# Patient Record
Sex: Female | Born: 1980
Health system: Southern US, Community
[De-identification: ages and names within clinical notes are randomized; demographics above are authoritative.]

## PROBLEM LIST (undated history)

## (undated) ENCOUNTER — Inpatient Hospital Stay (HOSPITAL_COMMUNITY): Payer: Self-pay

## (undated) DIAGNOSIS — Z789 Other specified health status: Secondary | ICD-10-CM

## (undated) DIAGNOSIS — B379 Candidiasis, unspecified: Secondary | ICD-10-CM

## (undated) HISTORY — PX: NO PAST SURGERIES: SHX2092

## (undated) HISTORY — DX: Other specified health status: Z78.9

## (undated) HISTORY — DX: Candidiasis, unspecified: B37.9

---
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
## 2001-01-27 ENCOUNTER — Ambulatory Visit (HOSPITAL_COMMUNITY): Admission: RE | Admit: 2001-01-27 | Discharge: 2001-01-27 | Payer: Self-pay | Admitting: *Deleted

## 2001-05-20 ENCOUNTER — Inpatient Hospital Stay (HOSPITAL_COMMUNITY): Admission: AD | Admit: 2001-05-20 | Discharge: 2001-05-22 | Payer: Self-pay | Admitting: *Deleted

## 2001-05-20 ENCOUNTER — Encounter (INDEPENDENT_AMBULATORY_CARE_PROVIDER_SITE_OTHER): Payer: Self-pay | Admitting: Specialist

## 2001-06-19 ENCOUNTER — Emergency Department (HOSPITAL_COMMUNITY): Admission: EM | Admit: 2001-06-19 | Discharge: 2001-06-19 | Payer: Self-pay

## 2001-06-19 ENCOUNTER — Encounter: Payer: Self-pay | Admitting: Emergency Medicine

## 2002-02-23 ENCOUNTER — Emergency Department (HOSPITAL_COMMUNITY): Admission: EM | Admit: 2002-02-23 | Discharge: 2002-02-23 | Payer: Self-pay | Admitting: Emergency Medicine

## 2002-12-21 ENCOUNTER — Encounter: Admission: RE | Admit: 2002-12-21 | Discharge: 2002-12-21 | Payer: Self-pay | Admitting: Sports Medicine

## 2006-03-24 ENCOUNTER — Ambulatory Visit: Payer: Self-pay | Admitting: Sports Medicine

## 2006-03-30 ENCOUNTER — Ambulatory Visit (HOSPITAL_COMMUNITY): Admission: RE | Admit: 2006-03-30 | Discharge: 2006-03-30 | Payer: Self-pay | Admitting: Sports Medicine

## 2006-04-29 ENCOUNTER — Ambulatory Visit: Payer: Self-pay | Admitting: Obstetrics & Gynecology

## 2006-05-14 ENCOUNTER — Ambulatory Visit (HOSPITAL_COMMUNITY): Admission: RE | Admit: 2006-05-14 | Discharge: 2006-05-14 | Payer: Self-pay | Admitting: Obstetrics and Gynecology

## 2006-05-14 ENCOUNTER — Ambulatory Visit: Payer: Self-pay | Admitting: Family Medicine

## 2006-06-04 ENCOUNTER — Ambulatory Visit: Payer: Self-pay | Admitting: Gynecology

## 2006-06-08 ENCOUNTER — Ambulatory Visit (HOSPITAL_COMMUNITY): Admission: RE | Admit: 2006-06-08 | Discharge: 2006-06-08 | Payer: Self-pay | Admitting: Obstetrics and Gynecology

## 2006-06-25 ENCOUNTER — Ambulatory Visit: Payer: Self-pay | Admitting: Gynecology

## 2006-07-16 ENCOUNTER — Ambulatory Visit: Payer: Self-pay | Admitting: Family Medicine

## 2006-07-30 ENCOUNTER — Ambulatory Visit (HOSPITAL_COMMUNITY): Admission: RE | Admit: 2006-07-30 | Discharge: 2006-07-30 | Payer: Self-pay | Admitting: Obstetrics and Gynecology

## 2006-07-30 ENCOUNTER — Ambulatory Visit: Payer: Self-pay | Admitting: Family Medicine

## 2006-08-06 ENCOUNTER — Ambulatory Visit: Payer: Self-pay | Admitting: Family Medicine

## 2006-08-20 ENCOUNTER — Ambulatory Visit: Payer: Self-pay | Admitting: *Deleted

## 2006-09-03 ENCOUNTER — Ambulatory Visit: Payer: Self-pay | Admitting: Family Medicine

## 2006-09-07 ENCOUNTER — Ambulatory Visit (HOSPITAL_COMMUNITY): Admission: RE | Admit: 2006-09-07 | Discharge: 2006-09-07 | Payer: Self-pay | Admitting: Obstetrics and Gynecology

## 2006-09-07 ENCOUNTER — Ambulatory Visit: Payer: Self-pay | Admitting: Obstetrics & Gynecology

## 2006-09-09 ENCOUNTER — Inpatient Hospital Stay (HOSPITAL_COMMUNITY): Admission: AD | Admit: 2006-09-09 | Discharge: 2006-09-09 | Payer: Self-pay | Admitting: Gynecology

## 2006-09-09 ENCOUNTER — Ambulatory Visit: Payer: Self-pay | Admitting: *Deleted

## 2006-09-10 ENCOUNTER — Ambulatory Visit: Payer: Self-pay | Admitting: Family Medicine

## 2006-09-14 ENCOUNTER — Ambulatory Visit (HOSPITAL_COMMUNITY): Admission: RE | Admit: 2006-09-14 | Discharge: 2006-09-14 | Payer: Self-pay | Admitting: Gynecology

## 2006-09-14 ENCOUNTER — Ambulatory Visit: Payer: Self-pay | Admitting: Obstetrics & Gynecology

## 2006-09-17 ENCOUNTER — Ambulatory Visit: Payer: Self-pay | Admitting: Family Medicine

## 2006-09-21 ENCOUNTER — Ambulatory Visit: Payer: Self-pay | Admitting: Obstetrics & Gynecology

## 2006-09-21 ENCOUNTER — Ambulatory Visit (HOSPITAL_COMMUNITY): Admission: RE | Admit: 2006-09-21 | Discharge: 2006-09-21 | Payer: Self-pay | Admitting: Gynecology

## 2006-09-24 ENCOUNTER — Ambulatory Visit: Payer: Self-pay | Admitting: Family Medicine

## 2006-09-28 ENCOUNTER — Ambulatory Visit (HOSPITAL_COMMUNITY): Admission: RE | Admit: 2006-09-28 | Discharge: 2006-09-28 | Payer: Self-pay | Admitting: Gynecology

## 2006-09-28 ENCOUNTER — Ambulatory Visit: Payer: Self-pay | Admitting: Obstetrics & Gynecology

## 2006-10-01 ENCOUNTER — Ambulatory Visit: Payer: Self-pay | Admitting: Family Medicine

## 2006-10-05 ENCOUNTER — Ambulatory Visit (HOSPITAL_COMMUNITY): Admission: RE | Admit: 2006-10-05 | Discharge: 2006-10-05 | Payer: Self-pay | Admitting: Gynecology

## 2006-10-05 ENCOUNTER — Ambulatory Visit: Payer: Self-pay | Admitting: Obstetrics & Gynecology

## 2006-10-07 ENCOUNTER — Ambulatory Visit: Payer: Self-pay | Admitting: Obstetrics and Gynecology

## 2006-10-07 ENCOUNTER — Inpatient Hospital Stay (HOSPITAL_COMMUNITY): Admission: AD | Admit: 2006-10-07 | Discharge: 2006-10-11 | Payer: Self-pay | Admitting: Obstetrics and Gynecology

## 2006-10-09 ENCOUNTER — Encounter (INDEPENDENT_AMBULATORY_CARE_PROVIDER_SITE_OTHER): Payer: Self-pay | Admitting: Specialist

## 2007-02-23 IMAGING — US US FETAL BPP W/O NONSTRESS
1 series · 18 of 23 positions shown · non-contrast
Comparison: none

CLINICAL DATA: 25-year-old female with 35 week gestation.  Decreased fetal movement.

[Series 1: us fetal bpp w/o nonstress · non-contrast · 18 of 23 slices shown]
[im 1/23]
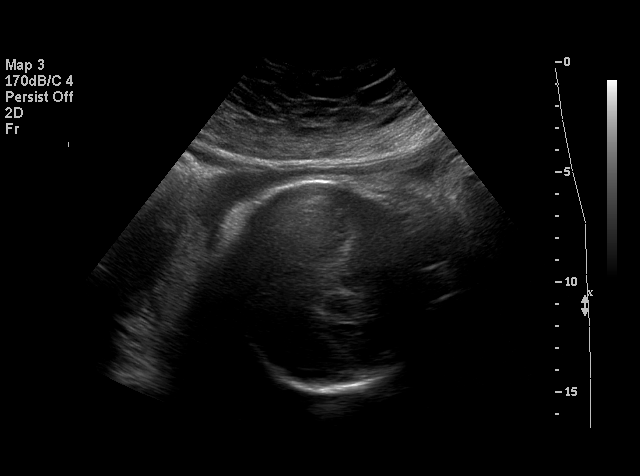
[im 2/23]
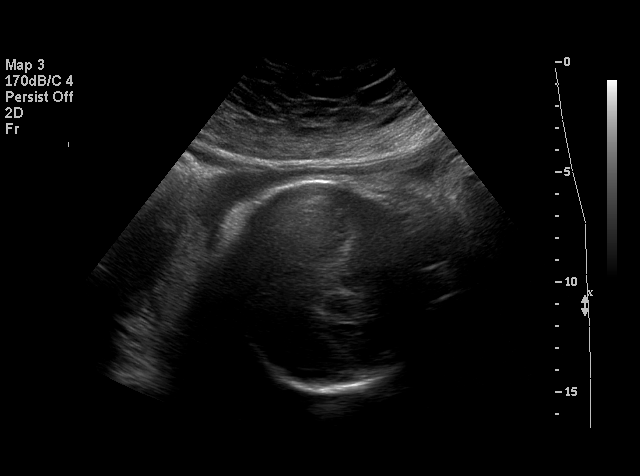
[im 4/23]
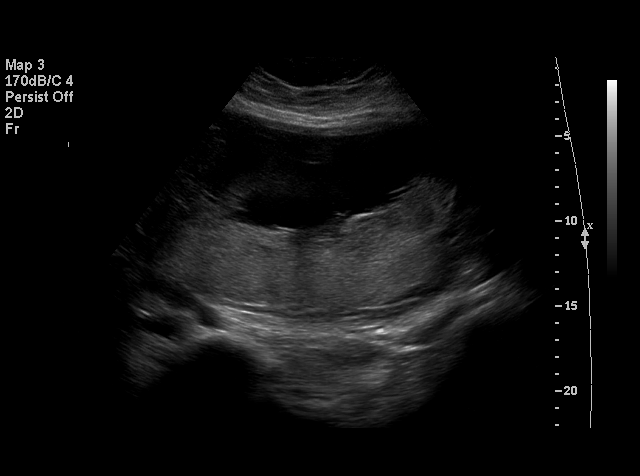
[im 5/23]
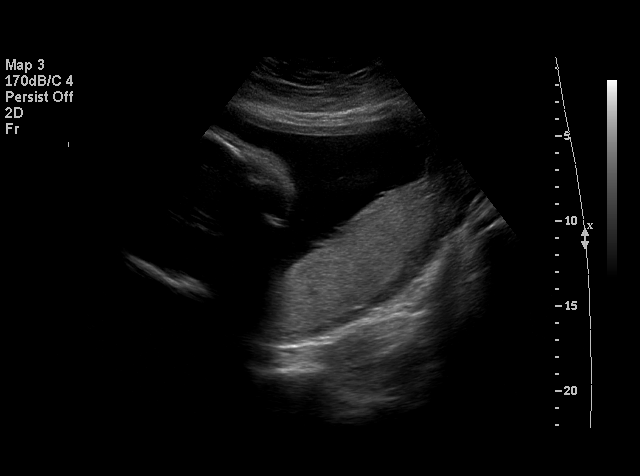
[im 6/23]
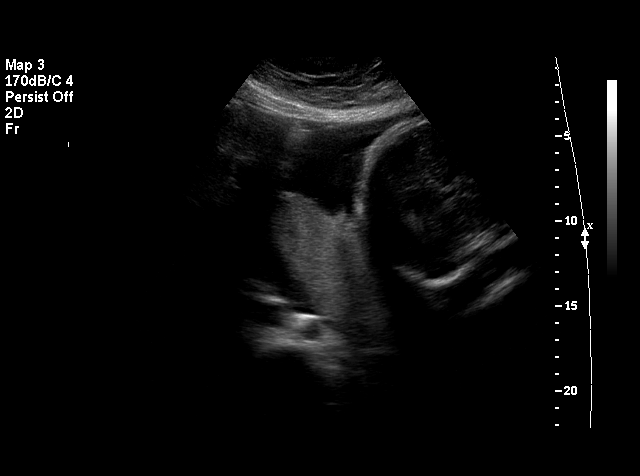
[im 8/23]
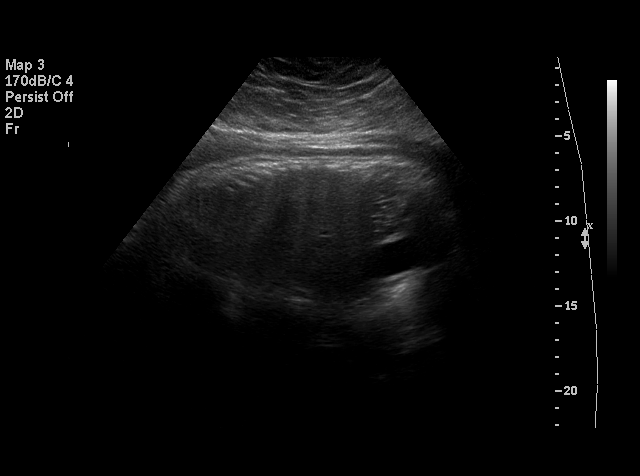
[im 9/23]
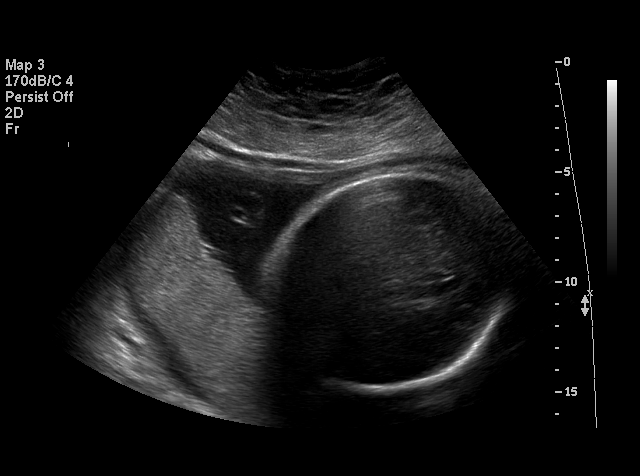
[im 10/23]
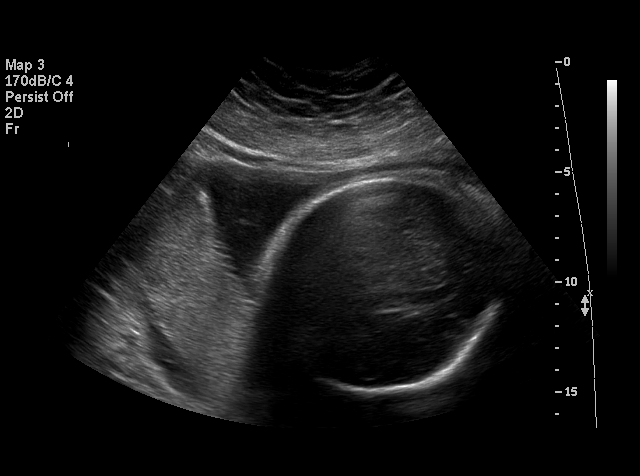
[im 11/23]
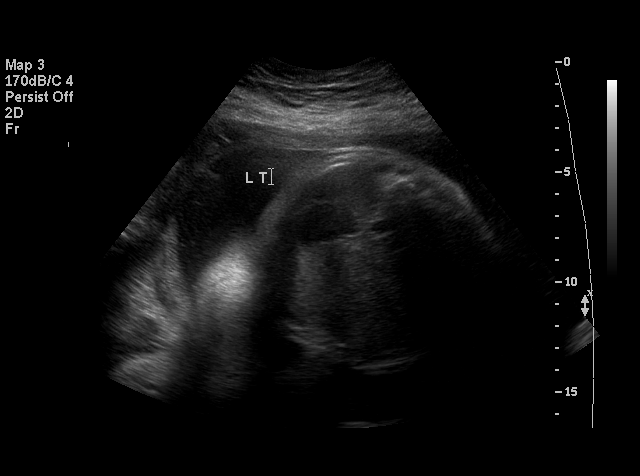
[im 13/23]
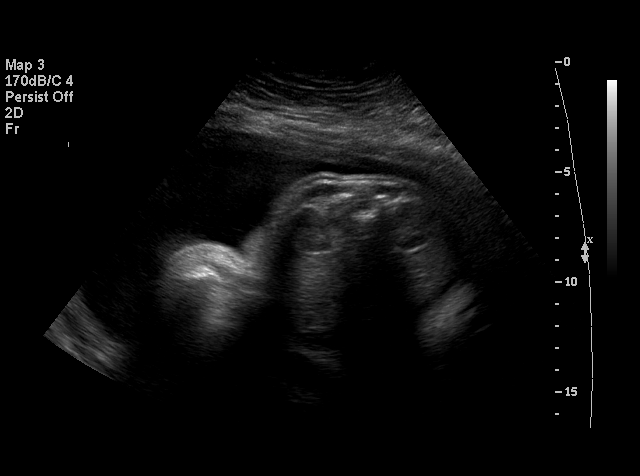
[im 14/23]
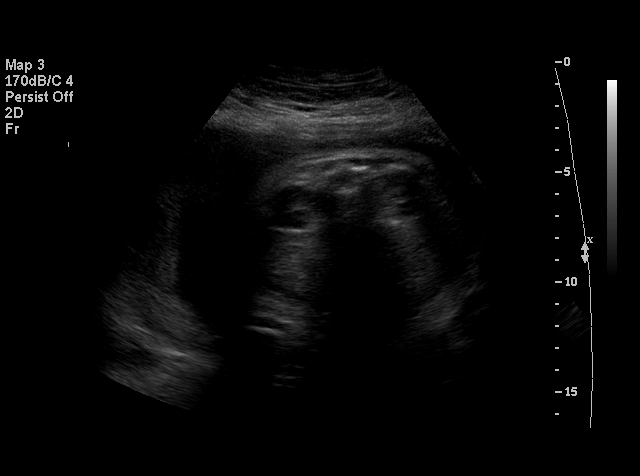
[im 15/23]
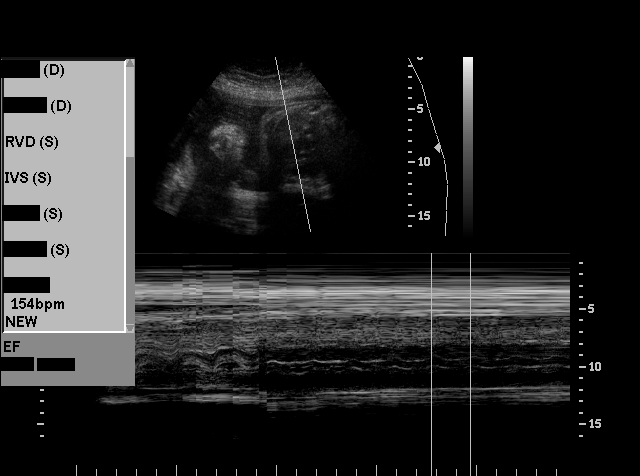
[im 16/23]
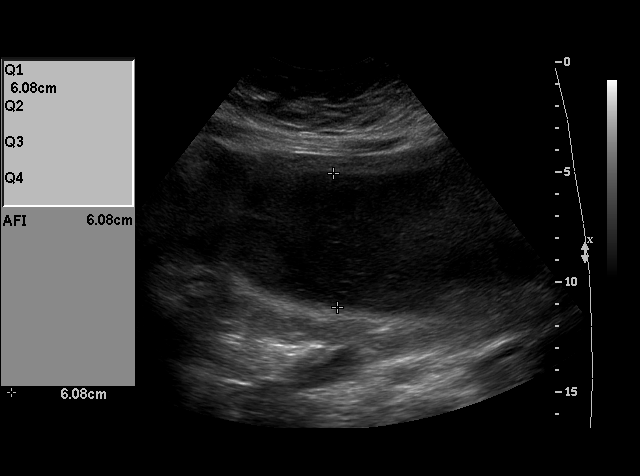
[im 18/23]
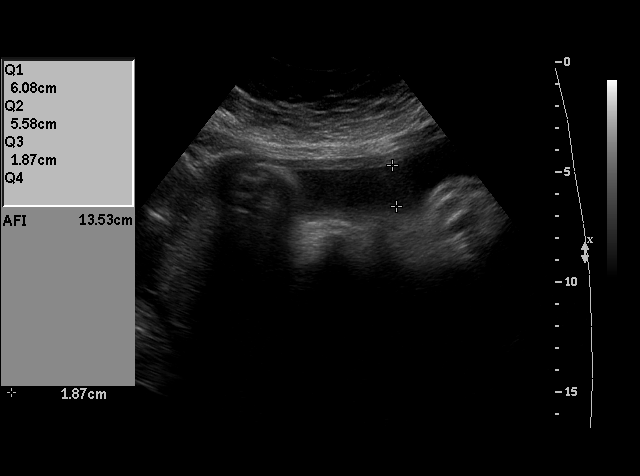
[im 19/23]
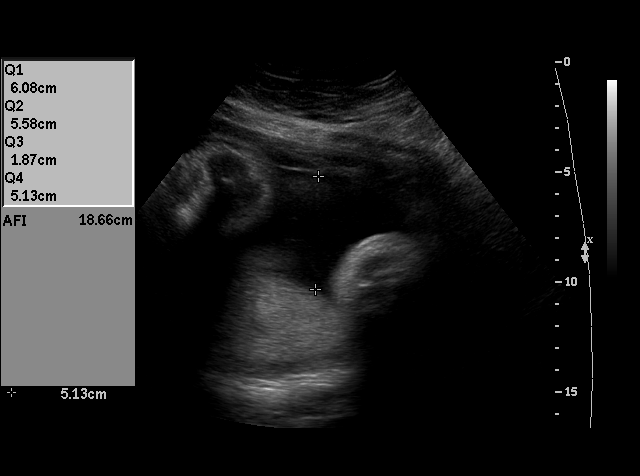
[im 20/23]
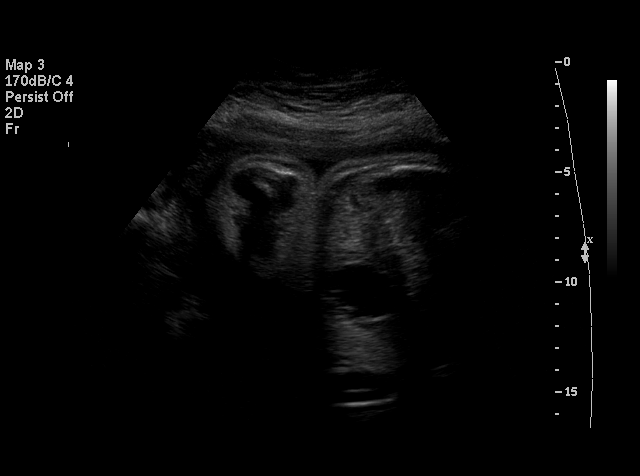
[im 22/23]
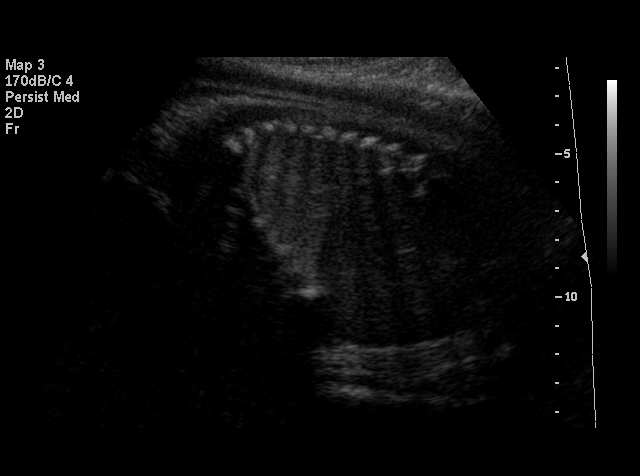
[im 23/23]
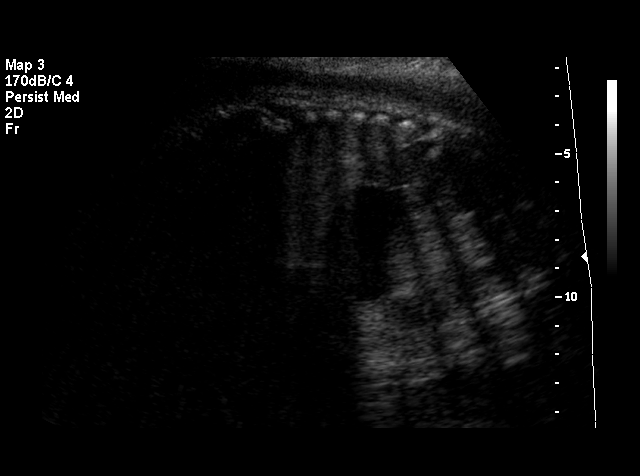

[18 of 23 positions shown; findings below may reference images not displayed]

BIOPHYSICAL PROFILE

 Number of Fetuses:  1
 Heart rate:  154 bpm
 Movement:  Yes
 Breathing:  Yes
 Presentation:  Breech
 Placental Location:  Fundal, posterior, left lateral
 Grade:  II
 Previa:  No
 Amniotic Fluid (Subjective):  Normal
 Amniotic Fluid (Objective):  AFI 18.66 cm (2th-02th %ile = 7.9 to 24.9 cm for 35 weeks) 

 Fetal measurements and complete anatomic evaluation were not requested.  The following fetal anatomy was visualized on this exam:  Lateral ventricles, stomach, kidneys, and bladder.  

 BPP SCORING
 Movements:  2  Time:  30 minutes
 Breathing:  0
 Tone:  2
 Amniotic Fluid:  2
 Total Score:  6

 MATERNAL UTERINE AND ADNEXAL FINDINGS
 Cervix:  Not evaluated; >34 weeks.
IMPRESSION: 1.  Single living intrauterine gestation in breech presentation with assigned gestational age of 35 weeks 3 days.  
 2.  Biophysical profile score of [DATE] at 30 minutes.

## 2007-08-26 ENCOUNTER — Ambulatory Visit: Payer: Self-pay | Admitting: Family Medicine

## 2007-08-26 ENCOUNTER — Encounter (INDEPENDENT_AMBULATORY_CARE_PROVIDER_SITE_OTHER): Payer: Self-pay | Admitting: Family Medicine

## 2007-08-26 DIAGNOSIS — K0510 Chronic gingivitis, plaque induced: Secondary | ICD-10-CM | POA: Insufficient documentation

## 2007-08-26 DIAGNOSIS — K047 Periapical abscess without sinus: Secondary | ICD-10-CM | POA: Insufficient documentation

## 2007-08-26 LAB — CONVERTED CEMR LAB
CO2: 23 meq/L (ref 19–32)
Calcium: 9 mg/dL (ref 8.4–10.5)
MCHC: 31.5 g/dL (ref 30.0–36.0)
MCV: 83.6 fL (ref 78.0–100.0)
Platelets: 282 10*3/uL (ref 150–400)
Potassium: 3.9 meq/L (ref 3.5–5.3)
Sodium: 138 meq/L (ref 135–145)
WBC: 5 10*3/uL (ref 4.0–10.5)

## 2007-12-13 ENCOUNTER — Ambulatory Visit: Payer: Self-pay | Admitting: Family Medicine

## 2007-12-13 ENCOUNTER — Telehealth: Payer: Self-pay | Admitting: *Deleted

## 2007-12-13 ENCOUNTER — Encounter (INDEPENDENT_AMBULATORY_CARE_PROVIDER_SITE_OTHER): Payer: Self-pay | Admitting: Family Medicine

## 2010-08-19 ENCOUNTER — Ambulatory Visit: Payer: Self-pay | Admitting: Family Medicine

## 2010-08-19 ENCOUNTER — Encounter: Payer: Self-pay | Admitting: Family Medicine

## 2010-08-19 DIAGNOSIS — E669 Obesity, unspecified: Secondary | ICD-10-CM | POA: Insufficient documentation

## 2010-08-19 DIAGNOSIS — N912 Amenorrhea, unspecified: Secondary | ICD-10-CM | POA: Insufficient documentation

## 2010-08-19 LAB — CONVERTED CEMR LAB
ALT: 13 units/L (ref 0–35)
AST: 12 units/L (ref 0–37)
Alkaline Phosphatase: 112 units/L (ref 39–117)
Antibody Screen: NEGATIVE
CO2: 21 meq/L (ref 19–32)
Creatinine, Ser: 0.56 mg/dL (ref 0.40–1.20)
Hemoglobin: 11.7 g/dL — ABNORMAL LOW (ref 12.0–15.0)
Lymphs Abs: 1.4 10*3/uL (ref 0.7–4.0)
Monocytes Relative: 5 % (ref 3–12)
Neutro Abs: 3.9 10*3/uL (ref 1.7–7.7)
Neutrophils Relative %: 69 % (ref 43–77)
RBC: 4.67 M/uL (ref 3.87–5.11)
Rh Type: POSITIVE
Rubella: 57.8 intl units/mL — ABNORMAL HIGH
Sickle Cell Screen: NEGATIVE
Total Bilirubin: 0.3 mg/dL (ref 0.3–1.2)
WBC: 5.7 10*3/uL (ref 4.0–10.5)

## 2010-08-29 ENCOUNTER — Ambulatory Visit: Payer: Self-pay | Admitting: Obstetrics & Gynecology

## 2010-08-29 ENCOUNTER — Encounter: Payer: Self-pay | Admitting: Obstetrics & Gynecology

## 2010-08-29 ENCOUNTER — Encounter: Payer: Self-pay | Admitting: Physician Assistant

## 2010-08-29 LAB — CONVERTED CEMR LAB
GC Probe Amp, Genital: NEGATIVE
Pap Smear: NEGATIVE

## 2010-08-30 ENCOUNTER — Encounter: Payer: Self-pay | Admitting: Family Medicine

## 2010-08-30 ENCOUNTER — Encounter: Payer: Self-pay | Admitting: Obstetrics & Gynecology

## 2010-08-30 ENCOUNTER — Ambulatory Visit: Payer: Self-pay | Admitting: Obstetrics & Gynecology

## 2010-08-30 LAB — CONVERTED CEMR LAB
Creatinine, Urine: 161.4 mg/dL
Protein, Ur: 78 mg/24hr (ref 50–100)
Trich, Wet Prep: NONE SEEN

## 2010-09-02 ENCOUNTER — Ambulatory Visit (HOSPITAL_COMMUNITY)
Admission: RE | Admit: 2010-09-02 | Discharge: 2010-09-02 | Payer: Self-pay | Source: Home / Self Care | Attending: Family Medicine | Admitting: Family Medicine

## 2010-09-02 ENCOUNTER — Ambulatory Visit: Payer: Self-pay | Admitting: Obstetrics & Gynecology

## 2010-09-03 ENCOUNTER — Ambulatory Visit: Payer: Self-pay | Admitting: Obstetrics and Gynecology

## 2010-09-05 ENCOUNTER — Encounter: Payer: Self-pay | Admitting: Physician Assistant

## 2010-09-05 ENCOUNTER — Ambulatory Visit: Payer: Self-pay | Admitting: Obstetrics & Gynecology

## 2010-09-06 ENCOUNTER — Ambulatory Visit: Payer: Self-pay | Admitting: Obstetrics & Gynecology

## 2010-09-09 ENCOUNTER — Ambulatory Visit: Payer: Self-pay | Admitting: Obstetrics & Gynecology

## 2010-09-09 ENCOUNTER — Ambulatory Visit (HOSPITAL_COMMUNITY)
Admission: RE | Admit: 2010-09-09 | Discharge: 2010-09-09 | Payer: Self-pay | Source: Home / Self Care | Attending: Family Medicine | Admitting: Family Medicine

## 2010-09-12 ENCOUNTER — Ambulatory Visit: Payer: Self-pay | Admitting: Family Medicine

## 2010-09-13 ENCOUNTER — Ambulatory Visit: Payer: Self-pay | Admitting: Obstetrics & Gynecology

## 2010-09-13 ENCOUNTER — Encounter: Payer: Self-pay | Admitting: Obstetrics & Gynecology

## 2010-09-17 ENCOUNTER — Ambulatory Visit (HOSPITAL_COMMUNITY)
Admission: RE | Admit: 2010-09-17 | Discharge: 2010-09-17 | Payer: Self-pay | Source: Home / Self Care | Attending: Family Medicine | Admitting: Family Medicine

## 2010-09-17 ENCOUNTER — Ambulatory Visit: Payer: Self-pay | Admitting: Obstetrics and Gynecology

## 2010-09-19 ENCOUNTER — Ambulatory Visit
Admission: RE | Admit: 2010-09-19 | Discharge: 2010-09-19 | Payer: Self-pay | Source: Home / Self Care | Attending: Obstetrics and Gynecology | Admitting: Obstetrics and Gynecology

## 2010-09-19 ENCOUNTER — Encounter: Payer: Self-pay | Admitting: Obstetrics and Gynecology

## 2010-09-19 ENCOUNTER — Ambulatory Visit: Payer: Self-pay | Admitting: Family Medicine

## 2010-09-20 ENCOUNTER — Encounter: Payer: Self-pay | Admitting: Obstetrics and Gynecology

## 2010-09-23 ENCOUNTER — Ambulatory Visit
Admission: RE | Admit: 2010-09-23 | Discharge: 2010-09-23 | Payer: Self-pay | Source: Home / Self Care | Attending: Obstetrics and Gynecology | Admitting: Obstetrics and Gynecology

## 2010-09-24 ENCOUNTER — Ambulatory Visit (HOSPITAL_COMMUNITY)
Admission: RE | Admit: 2010-09-24 | Discharge: 2010-09-24 | Payer: Self-pay | Source: Home / Self Care | Attending: Family Medicine | Admitting: Family Medicine

## 2010-09-26 ENCOUNTER — Ambulatory Visit
Admission: RE | Admit: 2010-09-26 | Discharge: 2010-09-26 | Payer: Self-pay | Source: Home / Self Care | Attending: Obstetrics and Gynecology | Admitting: Obstetrics and Gynecology

## 2010-09-26 LAB — POCT URINALYSIS DIPSTICK
Bilirubin Urine: NEGATIVE
Ketones, ur: NEGATIVE mg/dL
Nitrite: NEGATIVE
Protein, ur: NEGATIVE mg/dL
Specific Gravity, Urine: >=1.03 (ref 1.005–1.030)
Urine Glucose, Fasting: NEGATIVE mg/dL
Urobilinogen, UA: 1 mg/dL (ref 0.0–1.0)
pH: 6 (ref 5.0–8.0)

## 2010-09-28 ENCOUNTER — Inpatient Hospital Stay (HOSPITAL_COMMUNITY)
Admission: AD | Admit: 2010-09-28 | Discharge: 2010-09-28 | Payer: Self-pay | Source: Home / Self Care | Attending: Obstetrics and Gynecology | Admitting: Obstetrics and Gynecology

## 2010-09-30 ENCOUNTER — Ambulatory Visit
Admission: RE | Admit: 2010-09-30 | Discharge: 2010-09-30 | Payer: Self-pay | Source: Home / Self Care | Attending: Obstetrics & Gynecology | Admitting: Obstetrics & Gynecology

## 2010-09-30 ENCOUNTER — Ambulatory Visit (HOSPITAL_COMMUNITY)
Admission: RE | Admit: 2010-09-30 | Discharge: 2010-09-30 | Payer: Self-pay | Source: Home / Self Care | Attending: Family Medicine | Admitting: Family Medicine

## 2010-10-03 ENCOUNTER — Ambulatory Visit
Admission: RE | Admit: 2010-10-03 | Discharge: 2010-10-03 | Payer: Self-pay | Source: Home / Self Care | Attending: Obstetrics and Gynecology | Admitting: Obstetrics and Gynecology

## 2010-10-07 ENCOUNTER — Ambulatory Visit (HOSPITAL_COMMUNITY)
Admission: RE | Admit: 2010-10-07 | Discharge: 2010-10-07 | Payer: Self-pay | Source: Home / Self Care | Attending: Obstetrics and Gynecology | Admitting: Obstetrics and Gynecology

## 2010-10-07 ENCOUNTER — Ambulatory Visit
Admission: RE | Admit: 2010-10-07 | Discharge: 2010-10-07 | Payer: Self-pay | Source: Home / Self Care | Attending: Obstetrics and Gynecology | Admitting: Obstetrics and Gynecology

## 2010-10-07 LAB — POCT URINALYSIS DIPSTICK
Bilirubin Urine: NEGATIVE
Hgb urine dipstick: NEGATIVE
Ketones, ur: NEGATIVE mg/dL
Nitrite: NEGATIVE
Protein, ur: NEGATIVE mg/dL
Specific Gravity, Urine: 1.025 (ref 1.005–1.030)
Urine Glucose, Fasting: NEGATIVE mg/dL
Urobilinogen, UA: 0.2 mg/dL (ref 0.0–1.0)
pH: 6.5 (ref 5.0–8.0)

## 2010-10-07 LAB — URINE CULTURE: Culture  Setup Time: 201201080027

## 2010-10-07 LAB — URINALYSIS, ROUTINE W REFLEX MICROSCOPIC
Bilirubin Urine: NEGATIVE
Ketones, ur: 15 mg/dL — AB
Leukocytes, UA: NEGATIVE
Nitrite: NEGATIVE
Protein, ur: NEGATIVE mg/dL
Specific Gravity, Urine: >1.03 — ABNORMAL HIGH (ref 1.005–1.030)
Urine Glucose, Fasting: NEGATIVE mg/dL
Urobilinogen, UA: 0.2 mg/dL (ref 0.0–1.0)
pH: 6 (ref 5.0–8.0)

## 2010-10-07 LAB — URINE MICROSCOPIC-ADD ON

## 2010-10-10 ENCOUNTER — Ambulatory Visit: Admit: 2010-10-10 | Payer: Self-pay | Admitting: Obstetrics & Gynecology

## 2010-10-11 ENCOUNTER — Inpatient Hospital Stay (HOSPITAL_COMMUNITY)
Admission: RE | Admit: 2010-10-11 | Discharge: 2010-10-14 | Payer: Self-pay | Source: Home / Self Care | Attending: Obstetrics & Gynecology | Admitting: Obstetrics & Gynecology

## 2010-10-13 ENCOUNTER — Encounter: Payer: Self-pay | Admitting: *Deleted

## 2010-10-14 LAB — CBC
HCT: 35.9 % — ABNORMAL LOW (ref 36.0–46.0)
Hemoglobin: 11.9 g/dL — ABNORMAL LOW (ref 12.0–15.0)
MCH: 26 pg (ref 26.0–34.0)
MCHC: 33.1 g/dL (ref 30.0–36.0)
MCV: 78.4 fL (ref 78.0–100.0)
Platelets: 150 10*3/uL (ref 150–400)
RBC: 4.58 MIL/uL (ref 3.87–5.11)
RDW: 15.5 % (ref 11.5–15.5)
WBC: 5.8 10*3/uL (ref 4.0–10.5)

## 2010-10-22 NOTE — Miscellaneous (Signed)
  Clinical Lists Changes  Medications: Removed medication of VICODIN 5-500 MG  TABS (HYDROCODONE-ACETAMINOPHEN) 1-2 tabs every 6 hours as needed for pain Removed medication of PENICILLIN V POTASSIUM 500 MG  TABS (PENICILLIN V POTASSIUM) 1 by mouth QID x 10 days Removed medication of METRONIDAZOLE 500 MG  TABS (METRONIDAZOLE) One tablet twice a day for 7 days for gingival infection Removed medication of AMOXICILLIN 875 MG  TABS (AMOXICILLIN) One tablet twice a day for 10 days for gingival infection Removed medication of PERIOGARD 0.12 %  SOLN (CHLORHEXIDINE GLUCONATE) Swish and spit with 15ml of liquid twice a day.  Do not swallow.  Do not eat for 2-3 hours following treatment.  Dispense q.s 30 days

## 2010-10-22 NOTE — Assessment & Plan Note (Signed)
Summary: preg test,df   Vital Signs:  Patient profile:   30 year old female LMP:     01/11/2010 Height:      65.75 inches Weight:      379.6 pounds BMI:     61.96 Temp:     97.9 degrees F oral Pulse rate:   89 / minute BP sitting:   138 / 89  (left arm) Cuff size:   large  Vitals Entered By: Jimmy Footman, CMA (August 19, 2010 2:41 PM)  Menstrual History Menarche (age onset-years):  11 Menses Interval (days):  28 Menstrual Flow (days):  3-5 CC: preg test Is Patient Diabetic? No Pain Assessment Patient in pain? no      LMP (date): 01/11/2010 EDC by LMP==> 10/18/2010 EDC 10/18/2010 LMP - Character: normal LMP - Reliable? No Menarche (age onset years): 11   Menses interval (days): 28 Menstrual flow (days): 3-5 On BCP's at conception: no Enter LMP: 01/11/2010   Primary Provider:  Denny Levy MD  CC:  preg test.  History of Present Illness: Patient with thoughts that she was pregnant has positive urine pregnancy test. 7 monthes pregnant per LMP/Sexual contact.  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 Swish and Spit With 15ml of Liquid Twice A Day.  Do Not Swallow.  Do Not Eat For 2-3 Hours Following Treatment.  Dispense Q.s 30 Days 2)  Amoxicillin 875 Mg  Tabs (Amoxicillin) .... One Tablet Twice A Day For 10 Days For Gingival Infection 3)  Metronidazole 500 Mg  Tabs (Metronidazole) .... One Tablet Twice A  Day For 7 Days For Gingival Infection 4)  Penicillin V Potassium 500 Mg  Tabs (Penicillin V Potassium) .Marland Kitchen.. 1 By Mouth Qid X 10 Days 5)  Vicodin 5-500 Mg  Tabs (Hydrocodone-Acetaminophen) .Marland Kitchen.. 1-2 Tabs Every 6 Hours As Needed For Pain  Family History: Family History Diabetes 1st degree relative  Social History: Single Smoking Status:  never Occupation:  nursing Education:  GED  Review of Systems       The patient complains of weight gain.    Physical Exam  General:  alert, well-developed, well-nourished, well-hydrated, and overweight-appearing.   Head:  Normocephalic and atraumatic without obvious abnormalities. No apparent alopecia or balding. Eyes:  No corneal or conjunctival inflammation noted. EOMI. Perrla. Funduscopic exam benign, without hemorrhages, exudates or papilledema. Vision grossly normal. Breasts:  No mass, nodules, thickening, tenderness, bulging, retraction, inflamation, nipple discharge or skin changes noted.   Lungs:  Normal respiratory effort, chest expands symmetrically. Lungs are clear to auscultation, no crackles or wheezes. Heart:  Normal rate and regular rhythm. S1 and S2 normal without gallop, murmur, click, rub or other extra sounds. Abdomen:  soft and non-tender.   firm abdomen, like pregnancy   Impression & Recommendations:  Problem # 1:  PREGNANT STATE, INCIDENTAL (ICD-V22.2) Assessment New  incidental finding. High risk patient with previous still born term infant and late prenatal care. will refer to high risk OB clinic. will  start prenatal labs/ultrasound/one hour glocola test.  Orders: Other OB visit- [facility]) Prenatal-[facility] ([address]) 1 Hour GTT ([id number]) Urine Culture-[facility] (47829-56213) Sickle Cell Scr-[facility] (08657-84696) HIV-[facility] (29528-41324) Comp Met-[facility] (40102-72536) Urine Protein Ratio- [facility] (64403-47425) Glucose 1 hr-[facility] (95638) Prenatal U/S > 14 weeks - 75643 (Prenatal U/S) Other Referral (Other)  Problem # 2:  OBESITY,  UNSPECIFIED (ICD-278.00) Assessment: Deteriorated worsened weight and BMI 2nd to pregnant state. advised to alter diet and excercise more frequently.  Complete Medication List: 1)  Periogard 0.12 % Soln (Chlorhexidine gluconate) .... Swish and spit with 15ml of liquid twice a day.  do not swallow.  do not eat for 2-3 hours following treatment.  dispense q.s 30 days 2)  Amoxicillin 875 Mg Tabs (Amoxicillin) .... One tablet twice a day for 10 days for gingival infection 3)  Metronidazole 500 Mg Tabs (Metronidazole) .... One tablet twice a day for 7 days for gingival infection 4)  Penicillin V Potassium 500 Mg Tabs (Penicillin v potassium) .Marland Kitchen.. 1 by mouth qid x 10 days 5)  Vicodin 5-500 Mg Tabs (Hydrocodone-acetaminophen) .Marland Kitchen.. 1-2 tabs every 6 hours as needed for pain  Other Orders: U Preg-[facility] (81025)   Orders Added: 1)  U Preg-[facility] [81025] 2)  Other OB visit- [facility] [[facility]] 3)  Prenatal-[facility] [[address]] 4)  1 Hour GTT [[id number]-82950] 5)  Urine Culture-[facility] [32951-88416] 6)  Sickle Cell Scr-[facility] [60630-16010] 7)  HIV-[facility] [93235-57322] 8)  Comp Met-[facility] [80053-22900] 9)  Urine Protein Ratio- [facility] [02542-70623] 10)  Glucose 1 hr-[facility] [82950] 11)  Prenatal U/S > 14 weeks - 76283 [Prenatal U/S] 12)  Other Referral [Other]    Laboratory Results   Urine Tests  Date/Time Received: August 19, 2010 2:42 PM  Date/Time Reported: August 19, 2010 2:50 PM     Urine HCG: positive Comments: ...........test performed by...........Marland KitchenTerese Door, CMA      OB Initial Intake Information    Positive HCG by: clinic    Race: Black    Marital status: Single    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 Edd Arbour  Menstrual History    LMP (date): 01/11/2010    EDC by LMP: 10/18/2010    Best Working EDC: 10/18/2010    LMP - Character: normal     LMP - Reliable? : No    Menarche: 11 years    Menses interval: 28 days    Menstrual flow 3-5 days    On BCP's at conception: no    Pre Pregnancy Weight: 334 lbs.    Symptoms since LMP: amenorrhea, nausea, vomiting, fatigue, irritability, bloating, tender breasts   Past Pregnancy History    Gravida:     3    Term Births:     1    Living Children:   1    Para:       1    Prev. VBAC attempt?   success x1    Aborta:     1    Spont. Ab:     1  Pregnancy # 1    Delivery date:     09/22/2000    Weeks Gestation:   36    Preterm labor:     no    Comments:     still birth  Pregnancy # 2    Delivery date:  10/09/2006    Weeks Gestation:   40    Preterm labor:     no    Delivery type:     VBAC    Hours of labor:     3 hours    Anesthesia type:     epidural    [facility]    Infant Sex:     Female    Birth weight:     8 lbs    Name:     Levada Schilling    Comments:     Induced   Prenatal Visit EDC Confirmation:    New working [facility]: 10/18/2010    LMP reliable? No    Last menses onset (LMP) date: 01/11/2010    EDC by LMP: 10/18/2010   Genetic History    Genetic History Reviewed:    Father of baby:       Thalassemia:     mother: no    Neural tube defect:   mother: no    Down's Syndrome:   mother: no    Tay-Sachs:     mother: no    Sickle Cell Dz/Trait:   mother: no    Hemophilia:     mother: no    Muscular Dystrophy:   mother: no    Cystic Fibrosis:   mother: no    Huntington's Dz:   mother: no    Mental Retardation:   mother: no    Fragile X:     mother: no    Other Genetic or       Chromosomal Dz:   mother: no    Child with other       birth defect:     mother: no    > 3 spont. abortions:   mother: no    Hx of stillbirth:     mother: yes   Flowsheet View for Follow-up Visit    Estimated weeks of       gestation:     45 3/7    Weight:     379.6    Blood pressure:   138 / 89    Appended Document: Orders Update    Clinical Lists  Changes  Orders: Added new Test order of UA Glucose/Protein-[facility] [phone]) - Signed      Appended Document: UA results  Laboratory Results   Urine Tests  Date/Time Received: August 19, 2010 4:15 PM  Date/Time Reported: August 19, 2010 4:23 PM   Routine Urinalysis   Glucose: negative   (Normal Range: Negative) Protein: negative   (Normal Range: Negative)    Comments: ...........test performed by...........Marland KitchenTerese Door, CMA      Appended Document: preg test,df   OB Initial Intake Information    Positive HCG by: clinic    Race: Black    Marital status: Single    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 Edd Arbour  Menstrual History    LMP (date): 01/11/2010    LMP - Character: normal    Menarche: 11 years    Menses interval: 28 days    Menstrual flow 3-5 days    On BCP's at conception: no   Past Pregnancy History  Pregnancy # 2    Delivery type:     vaginal

## 2010-10-22 NOTE — Miscellaneous (Signed)
Summary: Orders Update  Clinical Lists Changes  Orders: Added new Test order of [facility]- Est  Level 4 (99214) - Signed 

## 2010-10-22 NOTE — Letter (Signed)
Summary: Handout Printed  Printed Handout:  - Prenatal-Record-[facility] 

## 2010-12-02 LAB — POCT URINALYSIS DIPSTICK
Bilirubin Urine: NEGATIVE
Glucose, UA: NEGATIVE mg/dL
Glucose, UA: NEGATIVE mg/dL
Nitrite: NEGATIVE
Nitrite: NEGATIVE
Nitrite: NEGATIVE
Nitrite: NEGATIVE
Protein, ur: NEGATIVE mg/dL
Protein, ur: NEGATIVE mg/dL
Urobilinogen, UA: 0.2 mg/dL (ref 0.0–1.0)
Urobilinogen, UA: 0.2 mg/dL (ref 0.0–1.0)
Urobilinogen, UA: 1 mg/dL (ref 0.0–1.0)
pH: 6 (ref 5.0–8.0)
pH: 6.5 (ref 5.0–8.0)

## 2010-12-03 LAB — GLUCOSE, CAPILLARY: Glucose-Capillary: 133 mg/dL — ABNORMAL HIGH (ref 70–99)

## 2011-03-10 IMAGING — US US OB FOLLOW-UP
1 series · 12 of 28 positions shown · non-contrast
Comparison: none

[Series 1: us ob follow up · 12 of 48 slices shown]
[im 2/48]
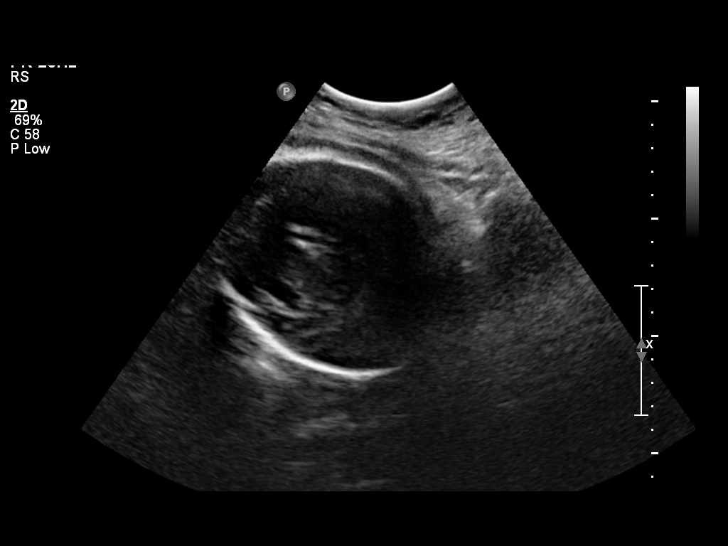
[im 6/48]
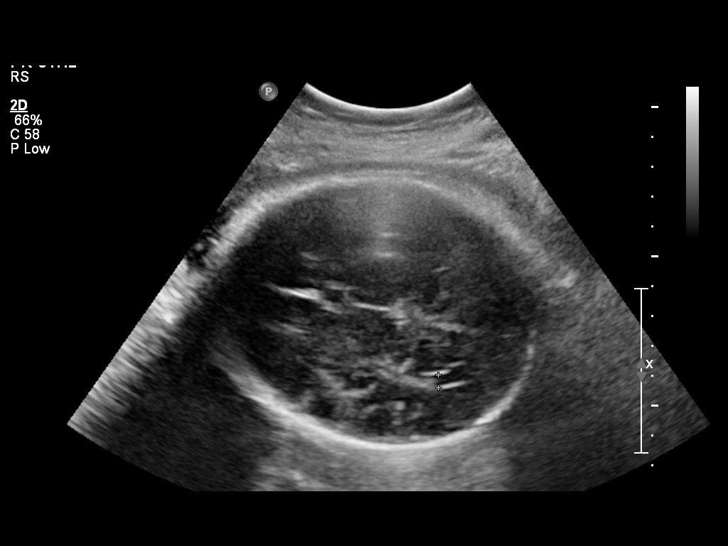
[im 9/48]
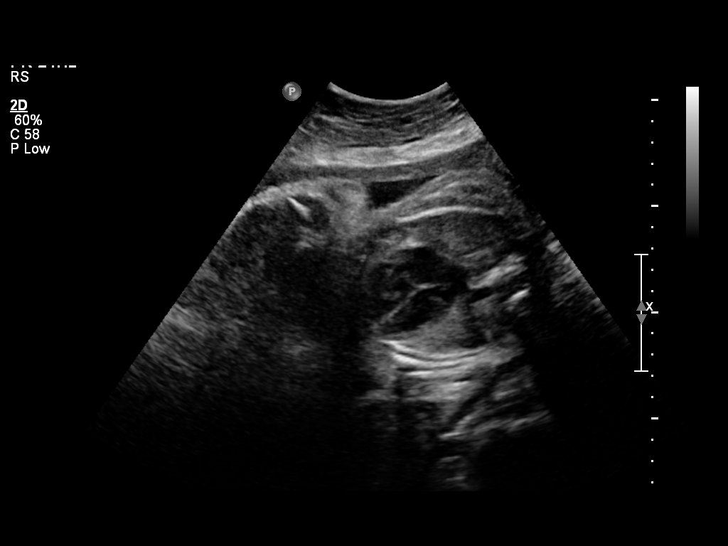
[im 14/48]
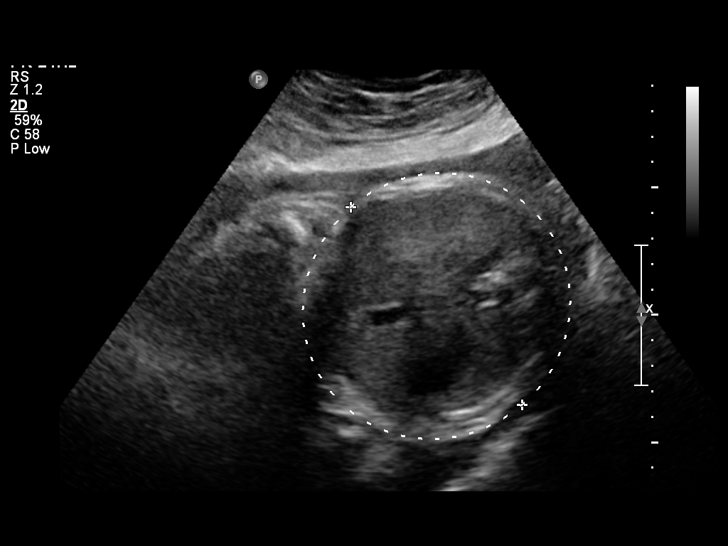
[im 18/48]
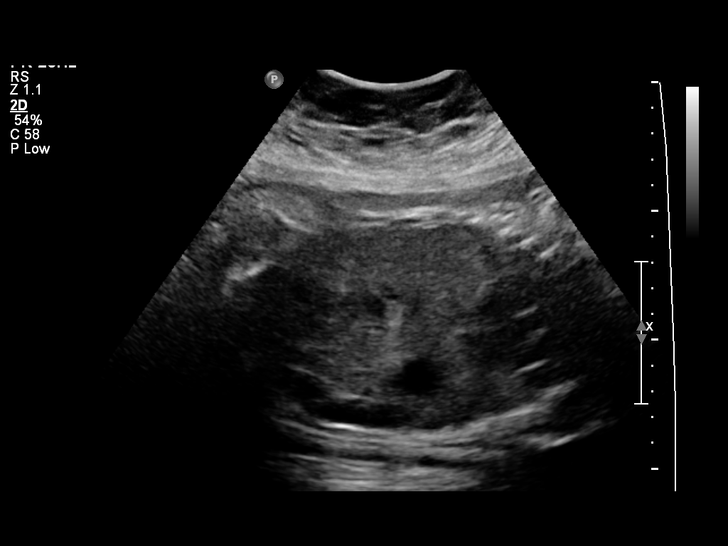
[im 21/48]
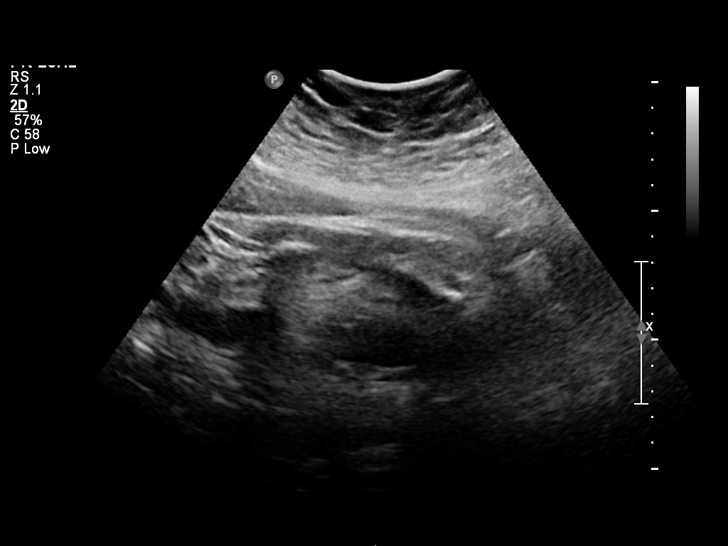
[im 27/48]
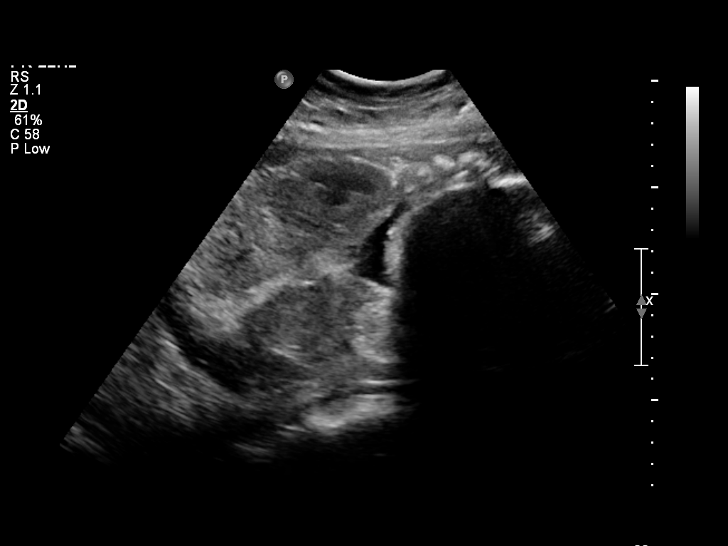
[im 30/48]
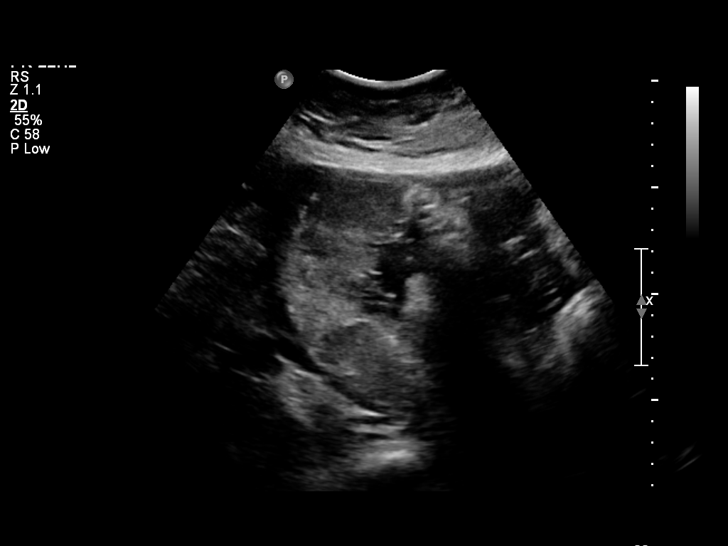
[im 34/48]
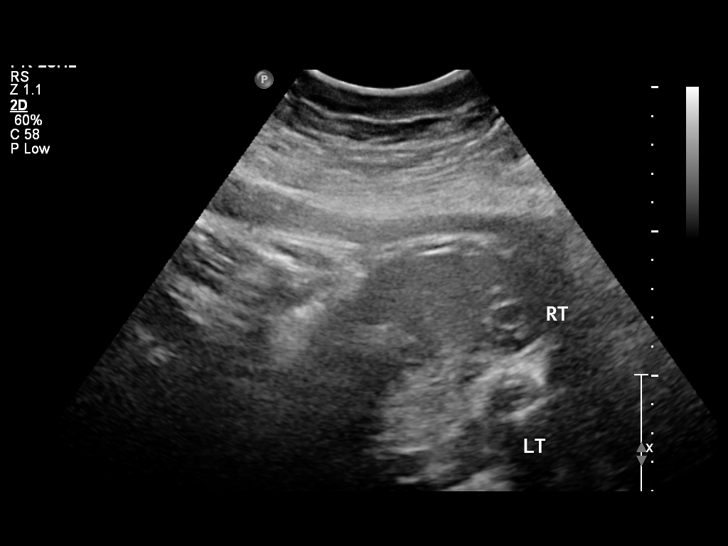
[im 39/48]
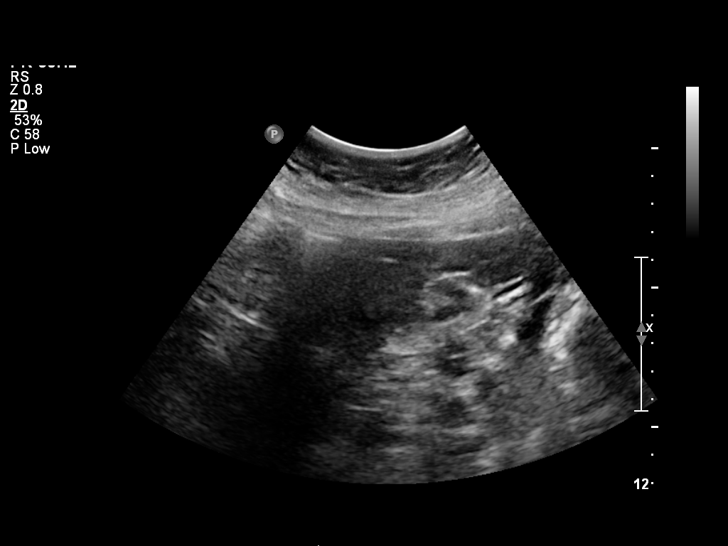
[im 42/48]
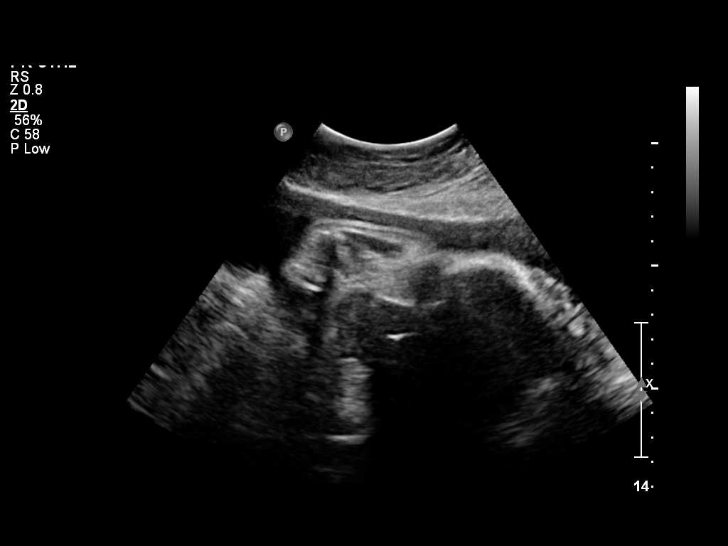
[im 46/48]
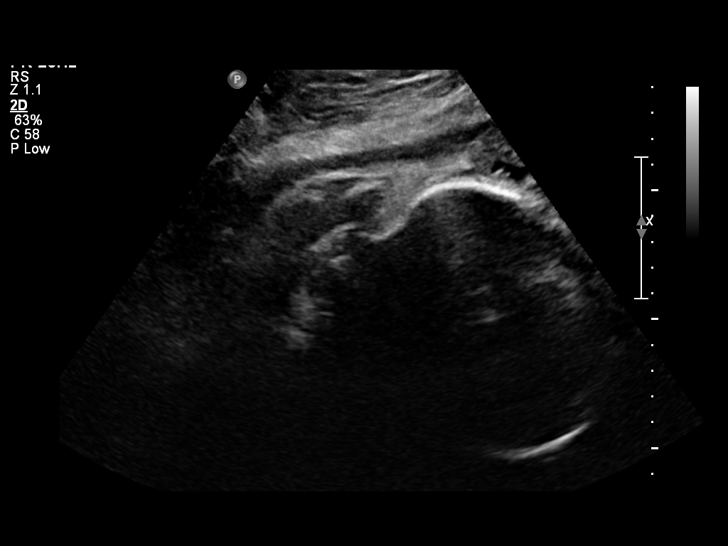

[12 of 28 positions shown; findings below may reference images not displayed]

OBSTETRICS REPORT
                      (Signed Final 09/24/2010 [DATE])

                 Hospital Clinic-
                 Faculty Physician
 Order#:         _O,_[id number]
Procedures

 US OB FOLLOW UP                                       76816.1
 US FETAL BPP W/O NS MODIFY                            76819.3
Indications

 Hypertension - Gestational
 Poor obstetric history: Previous IUFD (stillbirth)
Fetal Evaluation

 Fetal Heart Rate:  156                          bpm
 Cardiac Activity:  Observed
 Presentation:      Cephalic
 Placenta:          Right lateral, above
                    cervical os
 P. Cord            Not well visualized
 Insertion:

 Amniotic Fluid
 AFI FV:      Subjectively within normal limits
 AFI Sum:     15.37   cm       57  %Tile     Larg Pckt:    4.38  cm
 RUQ:   3.66    cm   RLQ:    3.32   cm    LUQ:   4.38    cm   LLQ:    4.01   cm
Biophysical Evaluation

 Amniotic F.V:   Within normal limits       F. Tone:        Observed
 F. Movement:    Observed                   Score:          [DATE]
 F. Breathing:   Observed
Biometry

 BPD:     88.1  mm     G. Age:  35w 4d                CI:         76.9   70 - 86
 OFD:    114.6  mm                                    FL/HC:      21.2   20.8 -

 HC:     327.9  mm     G. Age:  37w 2d       39  %    HC/AC:      0.97   0.92 -

 AC:     337.5  mm     G. Age:  37w 5d       86  %    FL/BPD:     78.8   71 - 87
 FL:      69.4  mm     G. Age:  35w 4d       24  %    FL/AC:      20.6   20 - 24

 Est. FW:    0592  gm    6 lb 12 oz      74  %
Gestational Age

 LMP:           36w 4d        Date:  01/11/10                 EDD:   10/18/10
 U/S Today:     36w 4d                                        EDD:   10/18/10
 Best:          36w 4d     Det. By:  LMP  (01/11/10)          EDD:   10/18/10
Anatomy

 Cranium:           Previously seen     Aortic Arch:       Basic anatomy
                                                           exam per order
 Fetal Cavum:       Previously seen     Ductal Arch:       Basic anatomy
                                                           exam per order
 Ventricles:        Appears normal      Diaphragm:         Basic anatomy
                                                           exam per order
 Choroid Plexus:    Previously seen     Stomach:           Appears
                                                           normal, left
                                                           sided
 Cerebellum:        Not well            Abdomen:           Previously seen
                    visualized
 Posterior Fossa:   Not well            Abdominal Wall:    Not well
                    visualized                             visualized
 Nuchal Fold:       Not applicable      Cord Vessels:      Appears normal
                    (>20 wks GA)                           (3 vessel cord)
 Face:              Lips previously     Kidneys:           Appear normal
                    seen
 Heart:             Left sided          Bladder:           Appears normal
 RVOT:              Basic anatomy       Spine:             Not well
                    exam per order                         visualized
 LVOT:              Basic anatomy       Limbs:             Four extremities
                    exam per order                         seen previously

 Other:     Technically difficult due to advanced GA and fetal
            position. Technically difficult due to  maternal habitus.
Cervix Uterus Adnexa

 Cervix:       Not visualized (advanced GA >34 wks)
 Uterus:       Possible anterior myoma visualized, see comments.
 Cul De Sac:   No free fluid seen.

 Left Ovary:    No adnexal mass visualized.
 Right Ovary:   No adnexal mass visualized.
 Adnexa:     No abnormality visualized.
 Comment:    Focal myometrial thickening anterior uterine wall-
             possible myoma vs. myometrial contraction: 6.5 x 3.6 x
             6.4 cm.
Impression

  Single living intrauterine gestation in cephalic presentation
 with concordant gestational age and fetal indices.  The EFW
 today is at the 74th percentile.

  Normal amniotic fluid volume.

  [DATE] BPP.

 questions or concerns.

## 2011-03-16 IMAGING — US US FETAL BPP W/O NONSTRESS
1 series · 7 of 7 positions shown · non-contrast
Comparison: none

[Series 1: us fetal bpp w/o nonstress · non-contrast · 7 acquisitions, 7 frames shown]
[im 1/7]
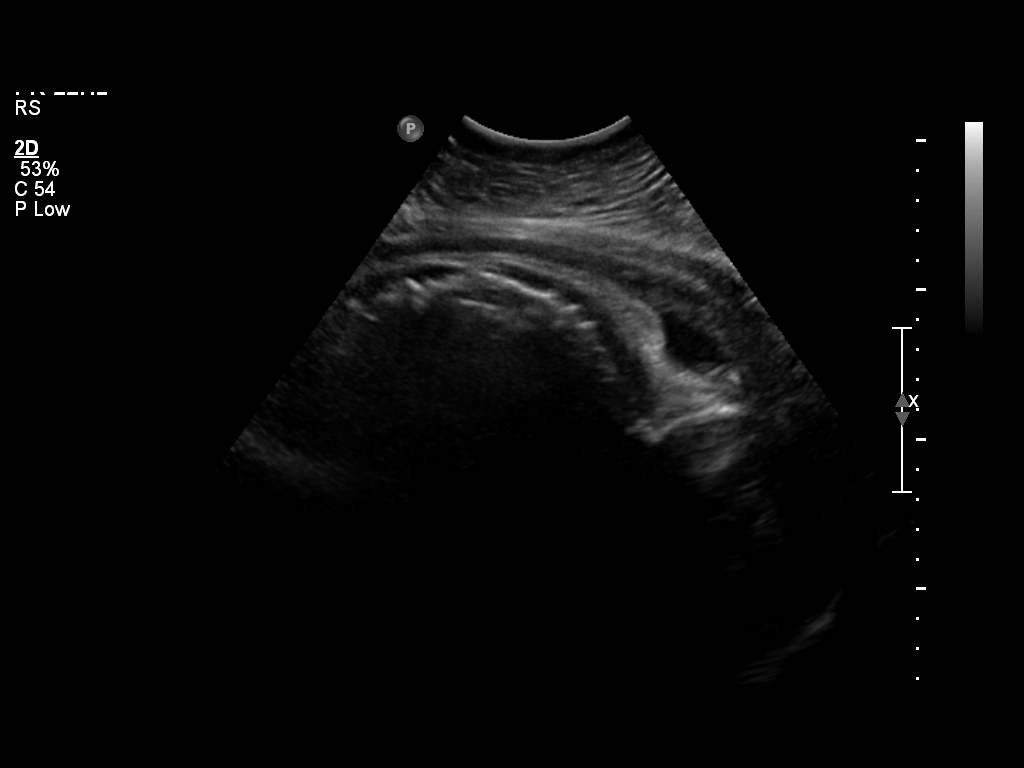
[im 2/7]
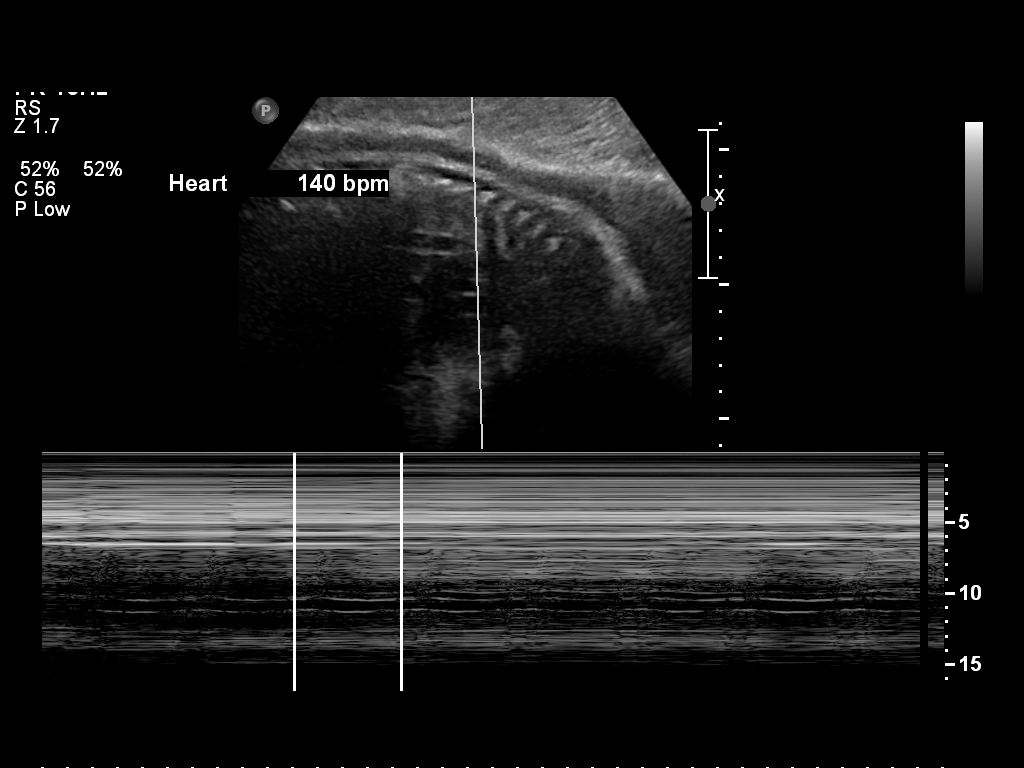
[im 3/7]
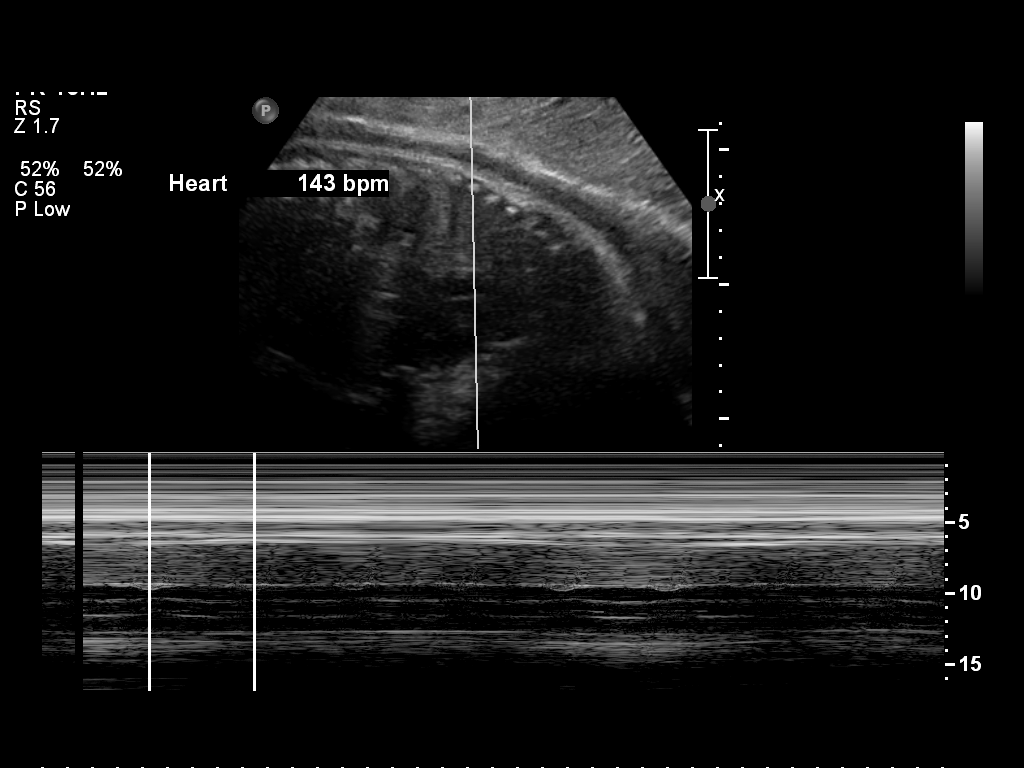
[im 4/7]
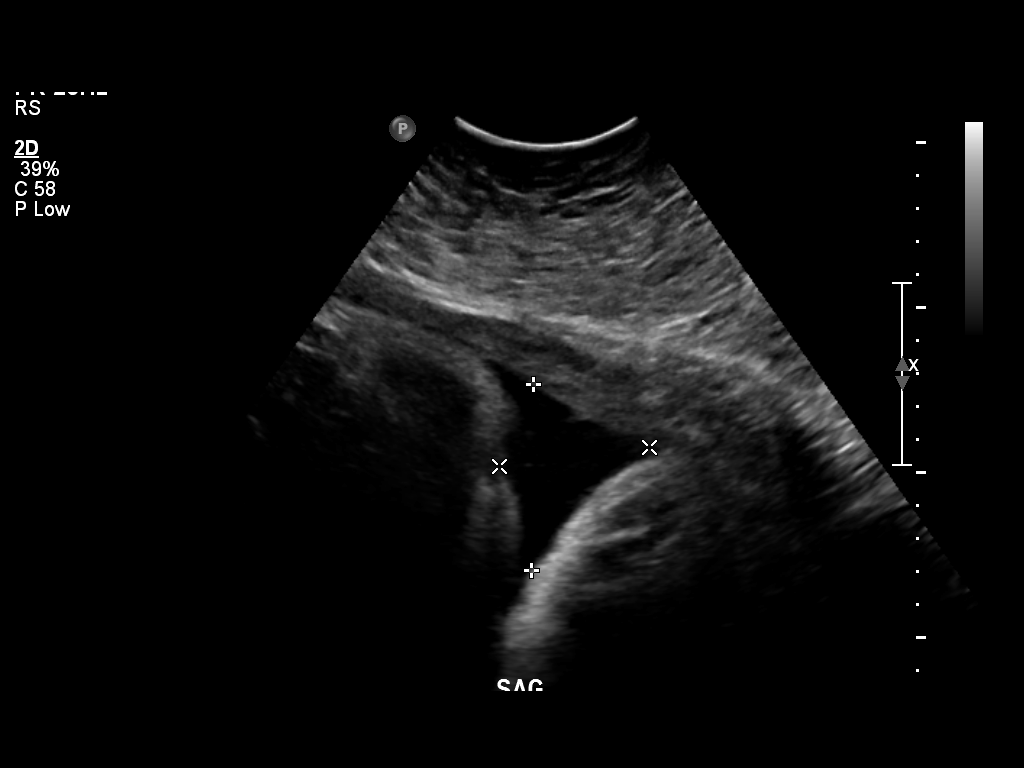
[im 5/7]
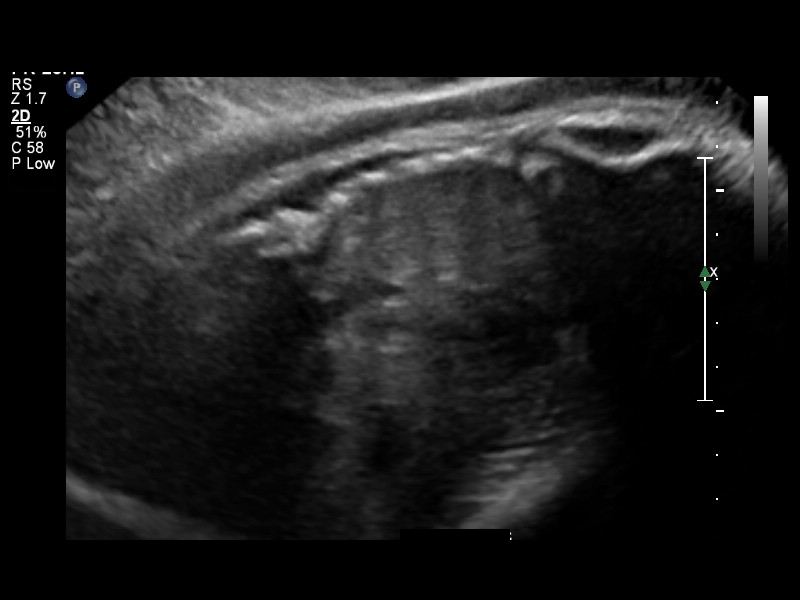
[im 6/7]
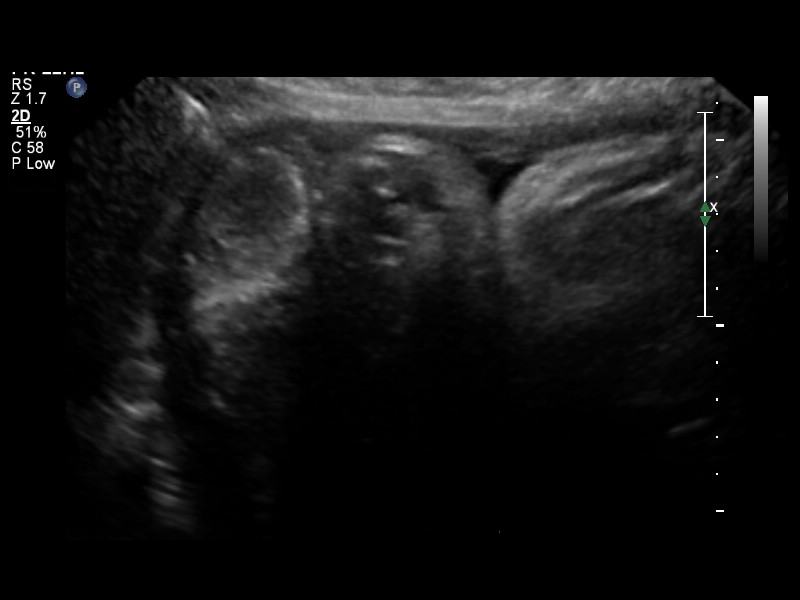
[im 7/7]
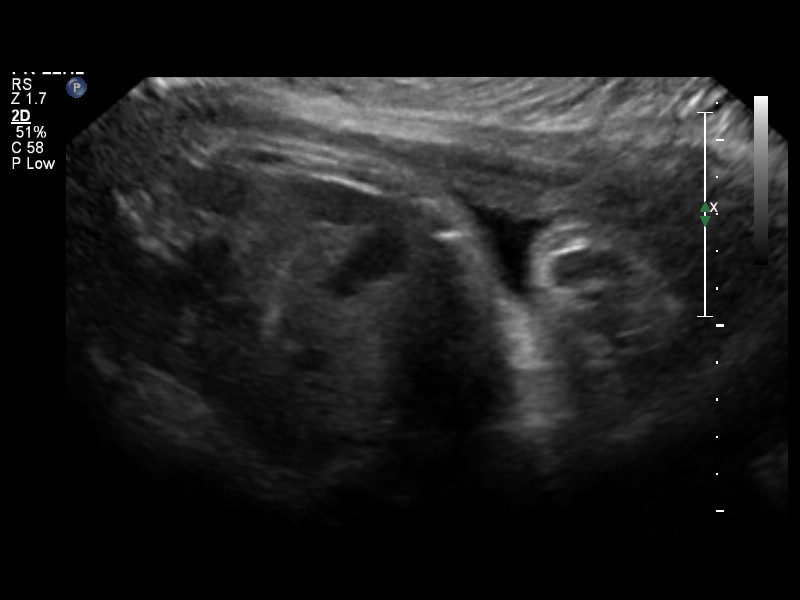

[7 of 7 positions shown; findings below may reference images not displayed]

OBSTETRICS REPORT
                      (Signed Final 09/30/2010 [DATE])

                 Hospital Clinic-
                 Faculty Physician
 Order#:         _O
Procedures

 US FETAL BPP W/O NONSTRESS                            76819.0
Indications

 Hypertension - Gestational
 Poor obstetric history: Previous IUFD (stillbirth)
 Assess fetal well being
Fetal Evaluation

 Fetal Heart Rate:  140                          bpm
 Cardiac Activity:  Observed
 Presentation:      Cephalic
 Placenta:          Right lateral, above
                    cervical os

 Amniotic Fluid
 AFI FV:      Subjectively within normal limits
Biophysical Evaluation

 Amniotic F.V:   Within normal limits       F. Tone:        Observed
 F. Movement:    Observed                   Score:          [DATE]
 F. Breathing:   Observed
Gestational Age

 LMP:           37w 3d        Date:  01/11/10                 EDD:   10/18/10
 Best:          37w 3d     Det. By:  LMP  (01/11/10)          EDD:   10/18/10
Cervix Uterus Adnexa

 Cervix:       Not visualized (advanced GA >34 wks)
Impression

 Single live IUP in cephalic presentation.  BPP [DATE].

 questions or concerns.

## 2012-10-11 ENCOUNTER — Emergency Department (HOSPITAL_COMMUNITY)
Admission: EM | Admit: 2012-10-11 | Discharge: 2012-10-11 | Disposition: A | Discharge status: Home / Self Care | Payer: Self-pay | Attending: Emergency Medicine | Admitting: Emergency Medicine

## 2012-10-11 ENCOUNTER — Encounter (HOSPITAL_COMMUNITY): Payer: Self-pay | Admitting: *Deleted

## 2012-10-11 DIAGNOSIS — R1110 Vomiting, unspecified: Secondary | ICD-10-CM | POA: Insufficient documentation

## 2012-10-11 MED ORDER — ONDANSETRON 4 MG PO TBDP
8.0000 mg | ORAL_TABLET | Freq: Once | ORAL | Status: AC
  Administered 2012-10-11: 8 mg via ORAL
  Filled 2012-10-11: qty 2

## 2012-10-11 MED ORDER — ONDANSETRON 8 MG PO TBDP: ORAL_TABLET | ORAL | Status: DC

## 2012-10-11 NOTE — ED Notes (Signed)
Pt has went to Peds waiting with children.

## 2012-10-11 NOTE — ED Notes (Signed)
Pt came in with c/o emesis that started tonight.

## 2012-10-11 NOTE — ED Provider Notes (Signed)
History     CSN: [id number]  Arrival date & time 10/11/12  2055   First MD Initiated Contact with Patient 10/11/12 2155      Chief Complaint  Patient presents with  . Emesis    (Consider location/radiation/quality/duration/timing/severity/associated sxs/prior treatment) HPI Comments: 32 yo who presents for vomiting.  The vomiting is non bloody, nonbilious. The vomiting started tonight. Multiple family members sick with same symptoms,  No diarrhea, no fever.  No rash, no abd pain.    Patient is a 32 y.o. female presenting with vomiting. The history is provided by the patient. No language interpreter was used.  Emesis  This is a new problem. The current episode started 12 to 24 hours ago. The problem occurs 2 to 4 times per day. The problem has not changed since onset.The emesis has an appearance of stomach contents. There has been no fever. Pertinent negatives include no cough, no diarrhea, no fever and no URI. Risk factors include ill contacts and suspect food intake.    History reviewed. No pertinent past medical history.  History reviewed. No pertinent past surgical history.  History reviewed. No pertinent family history.  History  Substance Use Topics  . Smoking status: Not on file  . Smokeless tobacco: Not on file  . Alcohol Use: Not on file    OB History    Grav Para Term Preterm Abortions TAB SAB Ect Mult Living                  Review of Systems  Constitutional: Negative for fever.  Respiratory: Negative for cough.   Gastrointestinal: Positive for vomiting. Negative for diarrhea.  All other systems reviewed and are negative.    Allergies  Review of patient's allergies indicates no known allergies.  Home Medications   Current Outpatient Rx  Name  Route  Sig  Dispense  Refill  . ONDANSETRON 8 MG PO TBDP      1 tab sl three times a day prn nausea and vomiting   6 tablet   0     BP 131/96  Pulse 87  Temp 97.5 F (36.4 C) (Oral)  Resp 16  SpO2  100%  Physical Exam  Nursing note and vitals reviewed. Constitutional: She is oriented to person, place, and time. She appears well-developed and well-nourished.  HENT:  Head: Normocephalic and atraumatic.  Right Ear: External ear normal.  Left Ear: External ear normal.  Mouth/Throat: Oropharynx is clear and moist.  Eyes: Conjunctivae normal and EOM are normal.  Neck: Normal range of motion. Neck supple.  Cardiovascular: Normal rate, normal heart sounds and intact distal pulses.   Pulmonary/Chest: Effort normal and breath sounds normal. She has no rales.  Abdominal: Soft. Bowel sounds are normal. There is no tenderness. There is no rebound and no guarding.  Musculoskeletal: Normal range of motion.  Neurological: She is alert and oriented to person, place, and time.  Skin: Skin is warm.    ED Course  Procedures (including critical care time)  Labs Reviewed - No data to display No results found.   1. Vomiting       MDM  75 y with acute onset of vomiting tonight along with childrens.  Likely viral gastro, or due to food.  No signs of dehydration that warrant IVF.  Will give zofran.  Will have follow up with pcp in 2 days if not improved. Discussed signs that warrant reevaluation.          Chrystine Oiler, MD  10/11/12 2216 

## 2012-10-14 ENCOUNTER — Emergency Department (HOSPITAL_COMMUNITY)
Admission: EM | Admit: 2012-10-14 | Discharge: 2012-10-14 | Disposition: A | Discharge status: Home / Self Care | Payer: Self-pay | Attending: Emergency Medicine | Admitting: Emergency Medicine

## 2012-10-14 ENCOUNTER — Encounter (HOSPITAL_COMMUNITY): Payer: Self-pay | Admitting: Emergency Medicine

## 2012-10-14 DIAGNOSIS — R112 Nausea with vomiting, unspecified: Secondary | ICD-10-CM | POA: Insufficient documentation

## 2012-10-14 DIAGNOSIS — Z3202 Encounter for pregnancy test, result negative: Secondary | ICD-10-CM | POA: Insufficient documentation

## 2012-10-14 DIAGNOSIS — R197 Diarrhea, unspecified: Secondary | ICD-10-CM | POA: Insufficient documentation

## 2012-10-14 LAB — URINALYSIS, ROUTINE W REFLEX MICROSCOPIC
Ketones, ur: 15 mg/dL — AB
Leukocytes, UA: NEGATIVE
Nitrite: NEGATIVE
Urobilinogen, UA: 0.2 mg/dL (ref 0.0–1.0)
pH: 5.5 (ref 5.0–8.0)

## 2012-10-14 LAB — URINE MICROSCOPIC-ADD ON

## 2012-10-14 LAB — CBC
Platelets: 193 10*3/uL (ref 150–400)
RDW: 15.6 % — ABNORMAL HIGH (ref 11.5–15.5)
WBC: 2.9 10*3/uL — ABNORMAL LOW (ref 4.0–10.5)

## 2012-10-14 LAB — BASIC METABOLIC PANEL
Chloride: 104 mEq/L (ref 96–112)
Creatinine, Ser: 0.56 mg/dL (ref 0.50–1.10)
GFR calc Af Amer: >90 mL/min (ref >90)
GFR calc non Af Amer: >90 mL/min (ref >90)
Potassium: 3.5 mEq/L (ref 3.5–5.1)

## 2012-10-14 MED ORDER — SODIUM CHLORIDE 0.9 % IV BOLUS (SEPSIS): 1000.0000 mL | Freq: Once | INTRAVENOUS | Status: DC

## 2012-10-14 MED ORDER — SODIUM CHLORIDE 0.9 % IV SOLN
Freq: Once | INTRAVENOUS | Status: AC
  Administered 2012-10-14: 16:00:00 via INTRAVENOUS

## 2012-10-14 MED ORDER — ONDANSETRON HCL 4 MG/2ML IJ SOLN
4.0000 mg | Freq: Once | INTRAMUSCULAR | Status: AC
  Administered 2012-10-14: 4 mg via INTRAVENOUS
  Filled 2012-10-14: qty 2

## 2012-10-14 MED ORDER — PANTOPRAZOLE SODIUM 40 MG IV SOLR
40.0000 mg | Freq: Once | INTRAVENOUS | Status: AC
  Administered 2012-10-14: 40 mg via INTRAVENOUS
  Filled 2012-10-14: qty 40

## 2012-10-14 MED ORDER — ONDANSETRON HCL 4 MG PO TABS: 4.0000 mg | ORAL_TABLET | Freq: Four times a day (QID) | ORAL | Status: DC

## 2012-10-14 NOTE — ED Notes (Addendum)
Pt presents to ED with c/o abdominal pain with n/v/d since Monday. Others in house have had the same symptoms. No active vomiting in triage.

## 2012-10-14 NOTE — ED Provider Notes (Addendum)
History     CSN: [id number]  Arrival date & time 10/14/12  1350   First MD Initiated Contact with Patient 10/14/12 [phone]      Chief Complaint  Patient presents with  . Abdominal Pain    (Consider location/radiation/quality/duration/timing/severity/associated sxs/prior treatment) HPI Comments: Patient presents with persistent nausea, diarrhea and abdominal cramping since Monday. She was seen 3 days ago with viral gastroenteritis type symptoms as her children were sick as well. She states the vomiting has improved the diarrhea has persisted. She is tolerating liquids but to go straight through. She's had up to 10 episodes of loose stools daily. Denies any blood in the stool. Denies abdominal pain except some occasional cramping. No fevers, urinary or vaginal symptoms. Her children are improving slowly.  The history is provided by the patient.    History reviewed. No pertinent past medical history.  History reviewed. No pertinent past surgical history.  History reviewed. No pertinent family history.  History  Substance Use Topics  . Smoking status: Not on file  . Smokeless tobacco: Not on file  . Alcohol Use: Not on file    OB History    Grav Para Term Preterm Abortions TAB SAB Ect Mult Living                  Review of Systems  Constitutional: Negative for fever and fatigue.  HENT: Negative for congestion and rhinorrhea.   Cardiovascular: Negative for chest pain.  Gastrointestinal: Positive for nausea, vomiting, abdominal pain and diarrhea.  Genitourinary: Negative for dysuria and hematuria.  Musculoskeletal: Negative for back pain.  Neurological: Negative for dizziness and weakness.  A complete 10 system review of systems was obtained and all systems are negative except as noted in the HPI and PMH.  Is lying in  Allergies  Review of patient's allergies indicates no known allergies.  Home Medications   No current outpatient prescriptions on file.  BP 121/58   Pulse 72  Temp 98.4 F (36.9 C) (Oral)  Resp 18  SpO2 98%  Physical Exam  Constitutional: She is oriented to person, place, and time. She appears well-developed and well-nourished. No distress.       Morbidly obese  HENT:  Head: Normocephalic.  Mouth/Throat: Oropharynx is clear and moist. No oropharyngeal exudate.       Dry mucous membranes  Eyes: Conjunctivae normal and EOM are normal. Pupils are equal, round, and reactive to light.  Neck: Normal range of motion. Neck supple.  Cardiovascular: Normal rate, regular rhythm and normal heart sounds.   No murmur heard. Pulmonary/Chest: Effort normal and breath sounds normal. No respiratory distress.  Abdominal: Soft. There is no tenderness. There is no rebound and no guarding.  Musculoskeletal: Normal range of motion. She exhibits no edema and no tenderness.  Neurological: She is alert and oriented to person, place, and time. No cranial nerve deficit. She exhibits normal muscle tone. Coordination normal.  Skin: Skin is warm.    ED Course  Procedures (including critical care time)  Labs Reviewed  CBC - Abnormal; Notable for the following:    WBC 2.9 (*)     Hemoglobin 11.6 (*)     MCH 25.2 (*)     RDW 15.6 (*)     All other components within normal limits  URINALYSIS, ROUTINE W REFLEX MICROSCOPIC - Abnormal; Notable for the following:    APPearance CLOUDY (*)     Specific Gravity, Urine 1.037 (*)     Ketones, ur 15 (*)  Protein, ur 30 (*)     All other components within normal limits  URINE MICROSCOPIC-ADD ON - Abnormal; Notable for the following:    Squamous Epithelial / LPF FEW (*)     Bacteria, UA MANY (*)     Crystals CA OXALATE CRYSTALS (*)     All other components within normal limits  BASIC METABOLIC PANEL  POCT PREGNANCY, URINE   No results found.   No diagnosis found.    MDM  3 Days of nausea, vomiting diarrhea associated crampy abdominal pain.  denies fevers, urinary or vaginal symptoms.  Vital stable,  nontoxic appearance, abdomen soft and nontender  Patient given IV fluids in the ED with antiemetics. She's had no vomiting or diarrhea in the ED. Her abdomen is soft and nontender. She feels back to baseline. Mild leukopenia. UA appears contaminated. Culture sent. Suspect viral gastroenteritis. Will treat with symptom control follow up with PCP. Return precautions discussed.  Glynn Octave, MD 10/14/12 1818  Glynn Octave, MD 10/14/12 Zollie Pee

## 2012-10-15 LAB — URINE CULTURE
Colony Count: NO GROWTH
Culture: NO GROWTH

## 2014-09-20 ENCOUNTER — Encounter (HOSPITAL_COMMUNITY): Payer: Self-pay | Admitting: Nurse Practitioner

## 2014-09-20 ENCOUNTER — Emergency Department (HOSPITAL_COMMUNITY)
Admission: EM | Admit: 2014-09-20 | Discharge: 2014-09-20 | Disposition: A | Discharge status: Home / Self Care | Payer: Self-pay | Attending: Emergency Medicine | Admitting: Emergency Medicine

## 2014-09-20 DIAGNOSIS — Y838 Other surgical procedures as the cause of abnormal reaction of the patient, or of later complication, without mention of misadventure at the time of the procedure: Secondary | ICD-10-CM | POA: Insufficient documentation

## 2014-09-20 DIAGNOSIS — K029 Dental caries, unspecified: Secondary | ICD-10-CM

## 2014-09-20 DIAGNOSIS — T8584XA Pain due to internal prosthetic devices, implants and grafts, not elsewhere classified, initial encounter: Secondary | ICD-10-CM | POA: Insufficient documentation

## 2014-09-20 DIAGNOSIS — T85848A Pain due to other internal prosthetic devices, implants and grafts, initial encounter: Secondary | ICD-10-CM

## 2014-09-20 MED ORDER — HYDROCODONE-ACETAMINOPHEN 5-325 MG PO TABS: 1.0000 | ORAL_TABLET | ORAL | Status: DC | PRN

## 2014-09-20 MED ORDER — IBUPROFEN 600 MG PO TABS: 600.0000 mg | ORAL_TABLET | Freq: Four times a day (QID) | ORAL | Status: DC | PRN

## 2014-09-20 MED ORDER — CLINDAMYCIN HCL 150 MG PO CAPS: 150.0000 mg | ORAL_CAPSULE | Freq: Four times a day (QID) | ORAL | Status: DC

## 2014-09-20 NOTE — ED Notes (Signed)
Pt presents with c/o of dental pain both upper and lower jaws. Rates pain 10/10.

## 2014-09-20 NOTE — ED Provider Notes (Signed)
CSN: [id number]     Arrival date & time 09/20/14  1745 History  This chart was scribed for non-physician practitioner, Junius FinnerErin O'Malley, PA-C,working with Arby BarretteMarcy Pfeiffer, MD, by Karle PlumberJennifer Tensley, ED Scribe. This patient was seen in room WTR6/WTR6 and the patient's care was started at 6:16 PM.  Chief Complaint  Patient presents with  . Dental Pain   Patient is a 33 y.o. female presenting with tooth pain. The history is provided by the patient. No language interpreter was used.  Dental Pain Associated symptoms: no fever     HPI Comments:  Kerri Medina is a 33 y.o. female who presents to the Emergency Department complaining of gradually worsening upper left and right-sided dental pain that began approximately one week ago. She reports the pain is 10/10. Pt has been taking Ibuprofen with only mild relief of the pain. Eating makes the pain worse. Denies alleviating factors. Denies fever, chills, nausea, vomiting, difficulty swallowing or difficulty breathing.   History reviewed. No pertinent past medical history. History reviewed. No pertinent past surgical history. No family history on file. History  Substance Use Topics  . Smoking status: Never Smoker   . Smokeless tobacco: Never Used  . Alcohol Use: No   OB History    No data available     Review of Systems  Constitutional: Negative for fever and chills.  HENT: Positive for dental problem. Negative for trouble swallowing.   Respiratory: Negative for shortness of breath.   Gastrointestinal: Negative for nausea and vomiting.  All other systems reviewed and are negative.   Allergies  Review of patient's allergies indicates no known allergies.  Home Medications   Prior to Admission medications   Medication Sig Start Date End Date Taking? Authorizing Provider  clindamycin (CLEOCIN) 150 MG capsule Take 1 capsule (150 mg total) by mouth every 6 (six) hours. 09/20/14   Junius FinnerErin O'Malley, PA-C  HYDROcodone-acetaminophen (NORCO/VICODIN)  5-325 MG per tablet Take 1-2 tablets by mouth every 4 (four) hours as needed for moderate pain or severe pain. 09/20/14   Junius FinnerErin O'Malley, PA-C  ibuprofen (ADVIL,MOTRIN) 600 MG tablet Take 1 tablet (600 mg total) by mouth every 6 (six) hours as needed. 09/20/14   Junius FinnerErin O'Malley, PA-C  ondansetron (ZOFRAN) 4 MG tablet Take 1 tablet (4 mg total) by mouth every 6 (six) hours. 10/14/12   Glynn OctaveStephen Rancour, MD   Triage Vitals: BP 149/80 mmHg  Pulse 68  Temp(Src) 97.8 F (36.6 C) (Oral)  Resp 16  SpO2 100%  LMP 09/20/2014 (Exact Date) Physical Exam  Constitutional: She is oriented to person, place, and time. She appears well-developed and well-nourished.  HENT:  Head: Normocephalic and atraumatic.  Mouth/Throat: Uvula is midline, oropharynx is clear and moist and mucous membranes are normal. Abnormal dentition. Dental caries present. No dental abscesses.  Upper gingiva with four front teeth missing. Remaining upper left and right teeth significant for dental decay down to gumline. No apical abscess. No airway involvement.  Eyes: EOM are normal.  Neck: Normal range of motion.  Cardiovascular: Normal rate.   Pulmonary/Chest: Effort normal.  Musculoskeletal: Normal range of motion.  Neurological: She is alert and oriented to person, place, and time.  Skin: Skin is warm and dry.  Psychiatric: She has a normal mood and affect. Her behavior is normal.  Nursing note and vitals reviewed.   ED Course  Procedures (including critical care time) DIAGNOSTIC STUDIES: Oxygen Saturation is 100% on RA, normal by my interpretation.   COORDINATION OF CARE: 6:18 PM- Will  give dental referral and prescribe antibiotics and pain medication. Pt verbalizes understanding and agrees to plan.  Medications - No data to display  Labs Review Labs Reviewed - No data to display  Imaging Review No results found.   EKG Interpretation None      MDM   Final diagnoses:  Dental decay  Dental implant pain, initial  encounter    Pt c/o dental pain x1 week. On exam, dental decay present. No gingival abscess requiring I&D at this time. Will tx with clindamycin, norco, and ibuprofen. Advised to call to schedule f/u appointment with Dr. Russella DarBenitez, DDs, for further evaluation and treatment of dental pain. Return precautions provided. Pt verbalized understanding and agreement with tx plan.   I personally performed the services described in this documentation, which was scribed in my presence. The recorded information has been reviewed and is accurate.    Junius FinnerErin O'Malley, PA-C 09/20/14 1833  Arby BarretteMarcy Pfeiffer, MD 09/21/14 [phone]

## 2014-09-20 NOTE — Discharge Instructions (Signed)
Dental Care and Dentist Visits Dental care supports good overall health. Regular dental visits can also help you avoid dental pain, bleeding, infection, and other more serious health problems in the future. It is important to keep the mouth healthy because diseases in the teeth, gums, and other oral tissues can spread to other areas of the body. Some problems, such as diabetes, heart disease, and pre-term labor have been associated with poor oral health.  See your dentist every 6 months. If you experience emergency problems such as a toothache or broken tooth, go to the dentist right away. If you see your dentist regularly, you may catch problems early. It is easier to be treated for problems in the early stages.  WHAT TO EXPECT AT A DENTIST VISIT  Your dentist will look for many common oral health problems and recommend proper treatment. At your regular dental visit, you can expect:  Gentle cleaning of the teeth and gums. This includes scraping and polishing. This helps to remove the sticky substance around the teeth and gums (plaque). Plaque forms in the mouth shortly after eating. Over time, plaque hardens on the teeth as tartar. If tartar is not removed regularly, it can cause problems. Cleaning also helps remove stains.  Periodic X-rays. These pictures of the teeth and supporting bone will help your dentist assess the health of your teeth.  Periodic fluoride treatments. Fluoride is a natural mineral shown to help strengthen teeth. Fluoride treatmentinvolves applying a fluoride gel or varnish to the teeth. It is most commonly done in children.  Examination of the mouth, tongue, jaws, teeth, and gums to look for any oral health problems, such as:  Cavities (dental caries). This is decay on the tooth caused by plaque, sugar, and acid in the mouth. It is best to catch a cavity when it is small.  Inflammation of the gums caused by plaque buildup (gingivitis).  Problems with the mouth or malformed  or misaligned teeth.  Oral cancer or other diseases of the soft tissues or jaws. KEEP YOUR TEETH AND GUMS HEALTHY For healthy teeth and gums, follow these general guidelines as well as your dentist's specific advice:  Have your teeth professionally cleaned at the dentist every 6 months.  Brush twice daily with a fluoride toothpaste.  Floss your teeth daily.  Ask your dentist if you need fluoride supplements, treatments, or fluoride toothpaste.  Eat a healthy diet. Reduce foods and drinks with added sugar.  Avoid smoking. TREATMENT FOR ORAL HEALTH PROBLEMS If you have oral health problems, treatment varies depending on the conditions present in your teeth and gums.  Your caregiver will most likely recommend good oral hygiene at each visit.  For cavities, gingivitis, or other oral health disease, your caregiver will perform a procedure to treat the problem. This is typically done at a separate appointment. Sometimes your caregiver will refer you to another dental specialist for specific tooth problems or for surgery. SEEK IMMEDIATE DENTAL CARE IF:  You have pain, bleeding, or soreness in the gum, tooth, jaw, or mouth area.  A permanent tooth becomes loose or separated from the gum socket.  You experience a blow or injury to the mouth or jaw area. Document Released: 05/21/2011 Document Revised: 12/01/2011 Document Reviewed: 05/21/2011 [facility] Patient Information 2015 [employer], [place]. This information is not intended to replace advice given to you by your health care provider. Make sure you discuss any questions you have with your health care provider.  Dental Caries Dental caries is tooth decay. This  decay can cause a hole in teeth (cavity) that can get bigger and deeper over time. HOME CARE  Brush and floss your teeth. Do this at least two times a day.  Use a fluoride toothpaste.  Use a mouth rinse if told by your dentist or doctor.  Eat less sugary and starchy foods.  Drink less sugary drinks.  Avoid snacking often on sugary and starchy foods. Avoid sipping often on sugary drinks.  Keep regular checkups and cleanings with your dentist.  Use fluoride supplements if told by your dentist or doctor.  Allow fluoride to be applied to teeth if told by your dentist or doctor. Document Released: 06/17/2008 Document Revised: 01/23/2014 Document Reviewed: 09/10/2012 [facility] Patient Information 2015 [place], [place]. This information is not intended to replace advice given to you by your health care provider. Make sure you discuss any questions you have with your health care provider.

## 2016-08-13 ENCOUNTER — Encounter (HOSPITAL_COMMUNITY): Payer: Self-pay | Admitting: Nurse Practitioner

## 2016-08-13 ENCOUNTER — Emergency Department (HOSPITAL_COMMUNITY)
Admission: EM | Admit: 2016-08-13 | Discharge: 2016-08-13 | Disposition: A | Discharge status: Home / Self Care | Payer: Self-pay | Attending: Emergency Medicine | Admitting: Emergency Medicine

## 2016-08-13 DIAGNOSIS — Z79899 Other long term (current) drug therapy: Secondary | ICD-10-CM | POA: Insufficient documentation

## 2016-08-13 DIAGNOSIS — N939 Abnormal uterine and vaginal bleeding, unspecified: Secondary | ICD-10-CM | POA: Insufficient documentation

## 2016-08-13 LAB — COMPREHENSIVE METABOLIC PANEL
ALBUMIN: 3.6 g/dL (ref 3.5–5.0)
ALT: 15 U/L (ref 14–54)
ANION GAP: 8 (ref 5–15)
AST: 19 U/L (ref 15–41)
Alkaline Phosphatase: 53 U/L (ref 38–126)
BUN: 12 mg/dL (ref 6–20)
CHLORIDE: 104 mmol/L (ref 101–111)
CO2: 24 mmol/L (ref 22–32)
Calcium: 8.8 mg/dL — ABNORMAL LOW (ref 8.9–10.3)
Creatinine, Ser: 0.71 mg/dL (ref 0.44–1.00)
GFR calc Af Amer: >60 mL/min (ref >60)
GFR calc non Af Amer: >60 mL/min (ref >60)
GLUCOSE: 101 mg/dL — AB (ref 65–99)
POTASSIUM: 3.7 mmol/L (ref 3.5–5.1)
SODIUM: 136 mmol/L (ref 135–145)
Total Bilirubin: 0.3 mg/dL (ref 0.3–1.2)
Total Protein: 7.6 g/dL (ref 6.5–8.1)

## 2016-08-13 LAB — URINE MICROSCOPIC-ADD ON
BACTERIA UA: NONE SEEN
WBC, UA: NONE SEEN WBC/hpf (ref 0–5)

## 2016-08-13 LAB — URINALYSIS, ROUTINE W REFLEX MICROSCOPIC
BILIRUBIN URINE: NEGATIVE
Glucose, UA: NEGATIVE mg/dL
Ketones, ur: NEGATIVE mg/dL
LEUKOCYTES UA: NEGATIVE
NITRITE: NEGATIVE
PH: 6 (ref 5.0–8.0)
Protein, ur: NEGATIVE mg/dL
SPECIFIC GRAVITY, URINE: 1.022 (ref 1.005–1.030)

## 2016-08-13 LAB — CBC WITH DIFFERENTIAL/PLATELET
BASOS ABS: 0 10*3/uL (ref 0.0–0.1)
BASOS PCT: 0 %
EOS ABS: 0.1 10*3/uL (ref 0.0–0.7)
Eosinophils Relative: 2 %
HEMATOCRIT: 31.7 % — AB (ref 36.0–46.0)
Hemoglobin: 9.8 g/dL — ABNORMAL LOW (ref 12.0–15.0)
Lymphocytes Relative: 42 %
Lymphs Abs: 2.3 10*3/uL (ref 0.7–4.0)
MCH: 23.8 pg — ABNORMAL LOW (ref 26.0–34.0)
MCHC: 30.9 g/dL (ref 30.0–36.0)
MCV: 77.1 fL — ABNORMAL LOW (ref 78.0–100.0)
MONO ABS: 0.3 10*3/uL (ref 0.1–1.0)
MONOS PCT: 5 %
NEUTROS ABS: 2.8 10*3/uL (ref 1.7–7.7)
NEUTROS PCT: 51 %
Platelets: 297 10*3/uL (ref 150–400)
RBC: 4.11 MIL/uL (ref 3.87–5.11)
RDW: 17.1 % — AB (ref 11.5–15.5)
WBC: 5.6 10*3/uL (ref 4.0–10.5)

## 2016-08-13 LAB — I-STAT BETA HCG BLOOD, ED (MC, WL, AP ONLY): I-stat hCG, quantitative: <5 m[IU]/mL (ref <5)

## 2016-08-13 LAB — WET PREP, GENITAL
CLUE CELLS WET PREP: NONE SEEN
SPERM: NONE SEEN
TRICH WET PREP: NONE SEEN
YEAST WET PREP: NONE SEEN

## 2016-08-13 MED ORDER — IBUPROFEN 800 MG PO TABS: 800.0000 mg | ORAL_TABLET | Freq: Three times a day (TID) | ORAL | 0 refills | Status: DC

## 2016-08-13 NOTE — ED Provider Notes (Signed)
[facility]-EMERGENCY DEPT Provider Note   CSN: [id number] Arrival date & time: 08/13/16  1546     History   Chief Complaint Chief Complaint  Patient presents with  . Vaginal Bleeding    Long Menses    HPI Kerri Medina is a 35 y.o. female.  HPI Kerri Medina is a 35 y.o. female with PMH significant for obesity who presents with vaginal bleeding.  Patient reports vaginal bleeding x 12 days.  She states her periods are normal pretty regular, every 21 days, and last about 7 days.  Last normal period was last month, she is unable to recall the date.  This menses began normally, but has persisted.  She reports passing clots as well.  She has had to use about 5-6 regular pads / day, but they were not soaked through when she changed them.  Associated symptoms include bilateral lower abdominal cramping.  No abnormal vaginal discharge, fever, N/V/D/C, or urinary symptoms.  She denies any new sexual partners or concern for STDs.  She denies any syncope, lightheadedness, or dizziness. She has been taking Ibuprofen and Tylenol with relief.  Nothing makes her symptoms worse.   History reviewed. No pertinent past medical history.  Patient Active Problem List   Diagnosis Date Noted  . OBESITY, UNSPECIFIED 08/19/2010  . 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 ABSCESS, TOOTH 08/26/2007  . CHRONIC GINGIVITIS PLAQUE INDUCED 08/26/2007    History reviewed. No pertinent surgical history.  OB History    No data available       Home Medications    Prior to Admission medications   Medication Sig Start Date End Date Taking? Authorizing Provider  clindamycin (CLEOCIN) 150 MG capsule Take 1 capsule (150 mg total) by mouth every 6 (six) hours. Patient not taking: Reported on 08/13/2016 09/20/14   Junius FinnerErin O'Malley, PA-C  HYDROcodone-acetaminophen (NORCO/VICODIN) 5-325 MG per tablet Take 1-2 tablets by mouth every 4 (four) hours as needed for moderate pain or severe pain. Patient not taking: Reported on 08/13/2016  09/20/14   Junius FinnerErin O'Malley, PA-C  ibuprofen (ADVIL,MOTRIN) 800 MG tablet Take 1 tablet (800 mg total) by mouth 3 (three) times daily. 08/13/16   Norvil Martensen, PA-C  ondansetron (ZOFRAN) 4 MG tablet Take 1 tablet (4 mg total) by mouth every 6 (six) hours. Patient not taking: Reported on 08/13/2016 10/14/12   Glynn OctaveStephen Rancour, MD    Family History History reviewed. No pertinent family history.  Social History Social History  Substance Use Topics  . Smoking status: Never Smoker  . Smokeless tobacco: Never Used  . Alcohol use No     Allergies   Patient has no known allergies.   Review of Systems Review of Systems All other systems negative unless otherwise stated in HPI   Physical Exam Updated Vital Signs BP 106/57   Pulse 72   Temp 98.6 F (37 C)   Resp 16   LMP 08/13/2016   SpO2 100%   Physical Exam  Constitutional: She is oriented to person, place, and time. She appears well-developed and well-nourished.  Non-toxic appearance. She does not have a sickly appearance. She does not appear ill.  HENT:  Head: Normocephalic and atraumatic.  Mouth/Throat: Oropharynx is clear and moist.  Eyes: Conjunctivae are normal.  Neck: Normal range of motion. Neck supple.  Cardiovascular: Normal rate and regular rhythm.   Pulmonary/Chest: Effort normal and breath sounds normal. No accessory muscle usage or stridor. No respiratory distress. She has no wheezes. She has no rhonchi. She has no rales.  Abdominal: Soft. Bowel sounds are normal. She exhibits no distension. There is no tenderness. There is no rebound and no guarding.  Genitourinary: There is no tenderness, lesion or injury on the right labia. There is no tenderness, lesion or injury on the left labia. Uterus is not tender. Cervix exhibits no motion tenderness and no discharge. Right adnexum displays no tenderness. Left adnexum displays no tenderness. There is bleeding in the vagina. No tenderness in the vagina. No vaginal discharge  found.  Genitourinary Comments: Chaperone present during exam.  Large clot in vaginal vault removed.  Cervical os closed.   Musculoskeletal: Normal range of motion.  Lymphadenopathy:    She has no cervical adenopathy.  Neurological: She is alert and oriented to person, place, and time.  Speech clear without dysarthria.  Skin: Skin is warm and dry.  Psychiatric: She has a normal mood and affect. Her behavior is normal.     ED Treatments / Results  Labs (all labs ordered are listed, but only abnormal results are displayed) Labs Reviewed  WET PREP, GENITAL - Abnormal; Notable for the following:       Result Value   WBC, Wet Prep HPF POC FEW (*)    All other components within normal limits  CBC WITH DIFFERENTIAL/PLATELET - Abnormal; Notable for the following:    Hemoglobin 9.8 (*)    HCT 31.7 (*)    MCV 77.1 (*)    MCH 23.8 (*)    RDW 17.1 (*)    All other components within normal limits  COMPREHENSIVE METABOLIC PANEL - Abnormal; Notable for the following:    Glucose, Bld 101 (*)    Calcium 8.8 (*)    All other components within normal limits  URINALYSIS, ROUTINE W REFLEX MICROSCOPIC (NOT AT [facility]) - Abnormal; Notable for the following:    APPearance CLOUDY (*)    Hgb urine dipstick LARGE (*)    All other components within normal limits  URINE MICROSCOPIC-ADD ON - Abnormal; Notable for the following:    Squamous Epithelial / LPF 0-5 (*)    All other components within normal limits  RPR  HIV ANTIBODY (ROUTINE TESTING)  I-STAT BETA HCG BLOOD, ED ([facility], [facility], [facility] ONLY)  GC/CHLAMYDIA PROBE AMP () NOT AT [facility]    EKG  EKG Interpretation None       Radiology No results found.  Procedures Procedures (including critical care time)  Medications Ordered in ED Medications - No data to display   Initial Impression / Assessment and Plan / ED Course  I have reviewed the triage vital signs and the nursing notes.  Pertinent labs & imaging results that were available  during my care of the patient were reviewed by me and considered in my medical decision making (see chart for details).  Clinical Course    Patient presents with findings consistent with dysfunctional uterine bleeding. She is not pregnant. She is hemodynamically stable. Hemoglobin stable, it is slightly lower than previous in 2014. She is asymptomatic. GC/Chlamydia, HIV, syphilis pending. Patient given follow-up with gynecology. Return precautions discussed. Stable for discharge.  Final Clinical Impressions(s) / ED Diagnoses   Final diagnoses:  Abnormal uterine bleeding    New Prescriptions Discharge Medication List as of 08/13/2016  7:51 PM       Cheri FowlerKayla Demiah Gullickson, PA-C 08/14/16 0113    Doug SouSam Jacubowitz, MD 08/14/16 [id number]

## 2016-08-13 NOTE — ED Triage Notes (Signed)
Pt states she had her period for the last 12 days, states that this is atypical of her and that her menses are usually "pretty regular." Denies being on birth control of any kind, last date of intercourse 21 days ago. Concerned about the heavy bleeding/clots and abdominal crumping.

## 2016-08-13 NOTE — Discharge Instructions (Signed)
Take 800 mg ibuprofen for pain. You may take this 3 times daily. Please follow-up with women's health for outpatient ultrasound and further lab work. He may also follow-up with your primary care physician for a gynecology referral. Return to emergency departments for severe worsening abdominal pain, increased vaginal bleeding, dizziness, lightheadedness, or feeling like he may pass out.

## 2016-08-13 NOTE — ED Notes (Signed)
Patient informed of the need for urine.  Will attempt after blood draw.

## 2016-08-13 NOTE — ED Notes (Signed)
Vaginal bleeding for 12 days

## 2016-08-13 NOTE — ED Notes (Signed)
Phlebotomy at bedside.

## 2016-08-14 LAB — HIV ANTIBODY (ROUTINE TESTING W REFLEX): HIV SCREEN 4TH GENERATION: NONREACTIVE

## 2016-08-14 LAB — RPR: RPR: NONREACTIVE

## 2016-08-15 LAB — GC/CHLAMYDIA PROBE AMP (~~LOC~~) NOT AT ARMC
Chlamydia: NEGATIVE
NEISSERIA GONORRHEA: NEGATIVE

## 2016-08-20 ENCOUNTER — Ambulatory Visit (INDEPENDENT_AMBULATORY_CARE_PROVIDER_SITE_OTHER): Payer: Self-pay | Admitting: Family Medicine

## 2016-08-20 VITALS — BP 136/72 | HR 83 | Temp 98.0°F | Ht 66.0 in | Wt 371.0 lb

## 2016-08-20 DIAGNOSIS — N938 Other specified abnormal uterine and vaginal bleeding: Secondary | ICD-10-CM

## 2016-08-20 LAB — CBC
HEMATOCRIT: 31.5 % — AB (ref 35.0–45.0)
HEMOGLOBIN: 9.6 g/dL — AB (ref 11.7–15.5)
MCH: 23.6 pg — ABNORMAL LOW (ref 27.0–33.0)
MCHC: 30.5 g/dL — AB (ref 32.0–36.0)
MCV: 77.4 fL — AB (ref 80.0–100.0)
MPV: 9 fL (ref 7.5–12.5)
Platelets: 333 10*3/uL (ref 140–400)
RBC: 4.07 MIL/uL (ref 3.80–5.10)
RDW: 17.1 % — AB (ref 11.0–15.0)
WBC: 5.5 10*3/uL (ref 3.8–10.8)

## 2016-08-21 ENCOUNTER — Ambulatory Visit
Admission: RE | Admit: 2016-08-21 | Discharge: 2016-08-21 | Disposition: A | Discharge status: Home / Self Care | Payer: No Typology Code available for payment source | Source: Ambulatory Visit | Attending: Family Medicine | Admitting: Family Medicine

## 2016-08-21 DIAGNOSIS — N938 Other specified abnormal uterine and vaginal bleeding: Secondary | ICD-10-CM

## 2016-08-21 LAB — TSH: TSH: 0.69 mIU/L

## 2016-08-21 NOTE — Progress Notes (Signed)
    CHIEF COMPLAINT / HPI:  Seen at ED for month long menstrual cycle. Here for follow up. Has had some irregularity in past bit never had a cycle that lasted a month. Not currently having any vaginal bleeding.  Per ED note:Patient reports vaginal bleeding x 12 days.  She states her periods are normal pretty regular, every 21 days, and last about 7 days.  Last normal period was last month, she is unable to recall the date.  This menses began normally, but has persisted.  She reports passing clots as well.  She has had to use about 5-6 regular pads / day, but they were not soaked through when she changed them.  Associated symptoms include bilateral lower abdominal cramping.  No abnormal vaginal discharge, fever, N/V/D/C, or urinary symptoms.  She denies any new sexual partners or concern for STDs.  She denies any syncope, lightheadedness, or dizziness. She has been taking Ibuprofen and Tylenol with relief.  Nothing makes her symptoms worse. "   PERTINENT  PMH / PSH: I have reviewed the patient's medications, allergies, past medical and surgical history. Pertinent findings that relate to today's visit / issues include: Never smoker obesity REVIEW OF SYSTEMS:  Pertinent review of systems: negative for fever or unusual weight change. No chest pain.. And same negtitves as in ED note which is copied forward for convenience.   OBJECTIVE:     Vital signs reviewed. GENERAL: Well-developed, well-nourished, no acute distress. Obese CARDIOVASCULAR: Regular rate and rhythm no murmur gallop or rub LUNGS: Clear to auscultation bilaterally, no rales or wheeze. ABDOMEN: Soft positive bowel sounds NEURO: No gross focal neurological deficits. MSK: Movement of extremity x 4.  Labs from ED: negative wet prep. Hemoglobin 9.8. Negative GC/Chlamydia, HIV and syphilis.    ASSESSMENT / PLAN: Please see problem oriented charting for details

## 2016-08-21 NOTE — Assessment & Plan Note (Signed)
Will  Recheck hgb to make sure it is stable.also check TSH. Get pelvic US. 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

## 2016-08-26 ENCOUNTER — Telehealth: Payer: Self-pay | Admitting: Family Medicine

## 2016-08-26 ENCOUNTER — Encounter: Payer: Self-pay | Admitting: Family Medicine

## 2016-08-26 NOTE — Telephone Encounter (Signed)
Dear Kerri Medina Team You can try calling her---looks like she lost her phone. If you cannot reach her,then  when she calls in let her know the results showed a very thick lining of the uterus. While this is not worrisome, it means I would like her to see GYN for further management as she is likely to have further bleeding. Once she gets this message, let me know and I will send the referral paperwork [facility]! Denny LevySara Kayde Medina

## 2016-08-26 NOTE — Telephone Encounter (Signed)
Would like to know results from last weeks ultrasound. She has lost her cell phone and will call back later today

## 2016-08-28 NOTE — Telephone Encounter (Signed)
Pt informed with good understanding. Shavawn Stobaugh T Andriea Hasegawa, CMA  

## 2016-08-30 ENCOUNTER — Encounter: Payer: Self-pay | Admitting: Family Medicine

## 2016-08-30 ENCOUNTER — Other Ambulatory Visit: Payer: Self-pay | Admitting: Family Medicine

## 2016-08-30 DIAGNOSIS — N938 Other specified abnormal uterine and vaginal bleeding: Secondary | ICD-10-CM

## 2016-09-16 ENCOUNTER — Telehealth: Payer: Self-pay

## 2016-09-16 NOTE — Telephone Encounter (Signed)
Murray Hodgkinsndrea Arpin called on Firday 12/23 @ 1242pm stating she was referred by another provider to been seen at our clinic and she needs to make an appt.

## 2016-10-02 ENCOUNTER — Telehealth: Payer: Self-pay | Admitting: Family Medicine

## 2016-10-02 NOTE — Telephone Encounter (Signed)
Pt is calling because she has been trying to get in contact [facility] for over a month now. She has called and left messages but know one calls her back and the one time someone answered she wasn't able to make a appointment. Can we call and see what the hold up is. Kerri Jacobsonjw

## 2016-10-02 NOTE — Telephone Encounter (Signed)
Pt informed of below. Zimmerman Rumple, Mahira Petras D, CMA  

## 2016-10-02 NOTE — Telephone Encounter (Signed)
[facility]ed [facility], No reason given as to why this referral has been waiting for a month, but it has now been sent to MD for review. They will contact patient to schedule.

## 2016-10-14 ENCOUNTER — Encounter: Payer: Self-pay | Admitting: Obstetrics and Gynecology

## 2016-11-12 ENCOUNTER — Ambulatory Visit (INDEPENDENT_AMBULATORY_CARE_PROVIDER_SITE_OTHER): Payer: Self-pay | Admitting: Obstetrics and Gynecology

## 2016-11-12 ENCOUNTER — Encounter: Payer: Self-pay | Admitting: Obstetrics and Gynecology

## 2016-11-12 VITALS — BP 120/79 | HR 75 | Ht 65.0 in | Wt 374.8 lb

## 2016-11-12 DIAGNOSIS — D649 Anemia, unspecified: Secondary | ICD-10-CM | POA: Insufficient documentation

## 2016-11-12 DIAGNOSIS — Z6841 Body Mass Index (BMI) 40.0 and over, adult: Secondary | ICD-10-CM

## 2016-11-12 DIAGNOSIS — N938 Other specified abnormal uterine and vaginal bleeding: Secondary | ICD-10-CM

## 2016-11-12 DIAGNOSIS — E6609 Other obesity due to excess calories: Secondary | ICD-10-CM

## 2016-11-12 DIAGNOSIS — D509 Iron deficiency anemia, unspecified: Secondary | ICD-10-CM

## 2016-11-12 DIAGNOSIS — IMO0001 Reserved for inherently not codable concepts without codable children: Secondary | ICD-10-CM

## 2016-11-12 MED ORDER — FERROUS SULFATE 325 (65 FE) MG PO TABS: 325.0000 mg | ORAL_TABLET | Freq: Every day | ORAL | Status: DC

## 2016-11-12 NOTE — Patient Instructions (Signed)
Dysfunctional Uterine Bleeding Introduction Dysfunctional uterine bleeding is abnormal bleeding from the uterus. Dysfunctional uterine bleeding includes:  A period that comes earlier or later than usual.  A period that is lighter, heavier, or has blood clots.  Bleeding between periods.  Skipping one or more periods.  Bleeding after sexual intercourse.  Bleeding after menopause. Follow these instructions at home: Pay attention to any changes in your symptoms. Follow these instructions to help with your condition: Eating and drinking  Eat well-balanced meals. Include foods that are high in iron, such as liver, meat, shellfish, green leafy vegetables, and eggs.  If you become constipated:  Drink plenty of water.  Eat fruits and vegetables that are high in water and fiber, such as spinach, carrots, raspberries, apples, and mango. Medicines  Take over-the-counter and prescription medicines only as told by your health care provider.  Do not change medicines without talking with your health care provider.  Aspirin or medicines that contain aspirin may make the bleeding worse. Do not take those medicines:  During the week before your period.  During your period.  If you were prescribed iron pills, take them as told by your health care provider. Iron pills help to replace iron that your body loses because of this condition. Activity  If you need to change your sanitary pad or tampon more than one time every 2 hours:  Lie in bed with your feet raised (elevated).  Place a cold pack on your lower abdomen.  Rest as much as possible until the bleeding stops or slows down.  Do not try to lose weight until the bleeding has stopped and your blood iron level is back to normal. Other Instructions  For two months, write down:  When your period starts.  When your period ends.  When any abnormal bleeding occurs.  What problems you notice.  Keep all follow up visits as told by  your health care provider. This is important. Contact a health care provider if:  You get light-headed or weak.  You have nausea and vomiting.  You cannot eat or drink without vomiting.  You feel dizzy or have diarrhea while you are taking medicines.  You are taking birth control pills or hormones, and you want to change them or stop taking them. Get help right away if:  You develop a fever or chills.  You need to change your sanitary pad or tampon more than one time per hour.  Your bleeding becomes heavier, or your flow contains clots more often.  You develop pain in your abdomen.  You lose consciousness.  You develop a rash. This information is not intended to replace advice given to you by your health care provider. Make sure you discuss any questions you have with your health care provider. Document Released: 09/05/2000 Document Revised: 02/14/2016 Document Reviewed: 12/04/2014  2017 Elsevier  

## 2016-11-12 NOTE — Progress Notes (Signed)
36 yo nulligravid who presents for evaluation of DUB. Pt report monthly cycles which are heavy and last 7 days. Her cycle however in November lasted for 21 days. Her cycles for Jan and Feb where now.  W/U thus far includes a normal TSH. IDA noted on CBC. GYN U/S was consistent with PCOS. She is recently married and is interested in conceiving. Sexual active with contraception  PE AF VSS Obese female in NAD  Lungs clear Heart RRR Abd soft +BS obese GU nl EGBUS cervix no lesion scant blood noted uterus small mobile no masses (limited by pt habitus  A/P DUB secondary to PCOS  Treatment options reviewed with pt. Pt declines medical treatment at this time. Pt encourage to work on diet and weight loss. Will start iron supplement and PNV. Pt instructed to keep a menstrual calendar as well.  She will follow up PRN

## 2017-02-04 IMAGING — US US TRANSVAGINAL NON-OB
1 series · 13 of 25 positions shown · non-contrast
Comparison: None

CLINICAL DATA: Dysfunctional uterine bleeding.

EXAM:
TRANSABDOMINAL AND TRANSVAGINAL ULTRASOUND OF PELVIS
TECHNIQUE: Both transabdominal and transvaginal ultrasound examinations of the
pelvis were performed. Transabdominal technique was performed for
global imaging of the pelvis including uterus, ovaries, adnexal
regions, and pelvic cul-de-sac. It was necessary to proceed with
endovaginal exam following the transabdominal exam to visualize the
uterus and ovaries.

[Series 1: us transvaginal non-ob · 0.30mm/px · 13 of 71 slices shown]
[im 1/71]
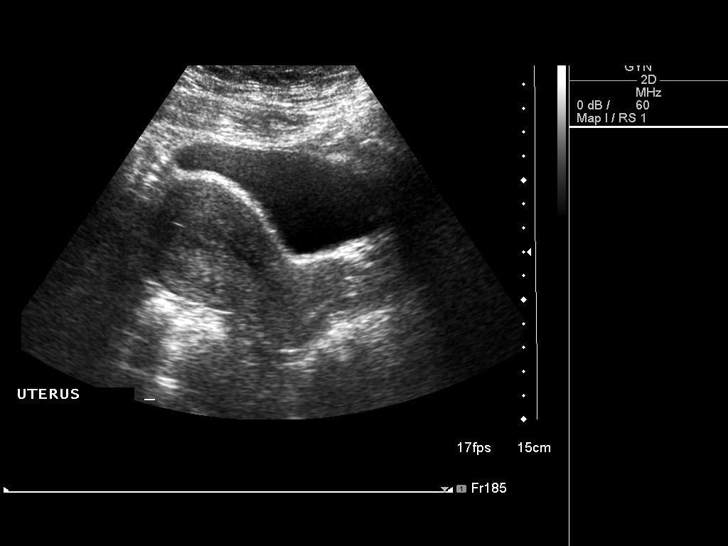
[im 6/71]
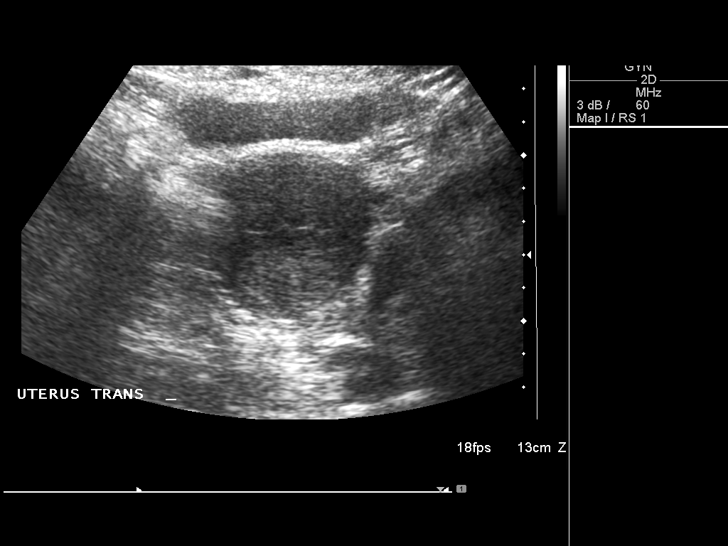
[im 12/71]
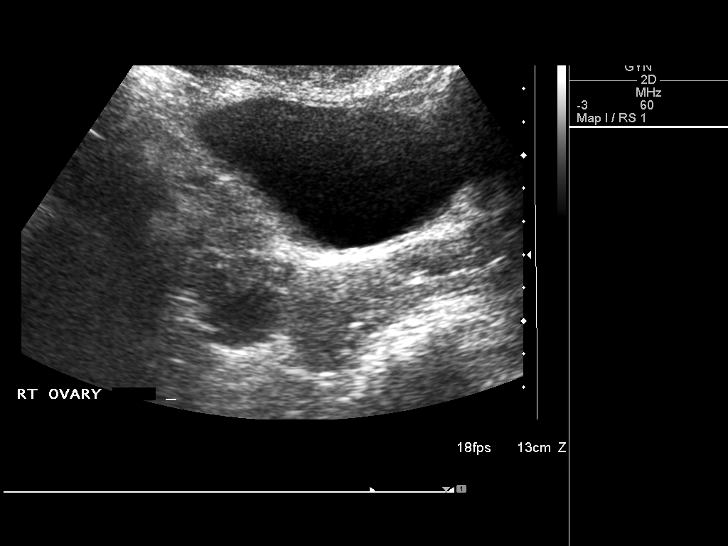
[im 18/71]
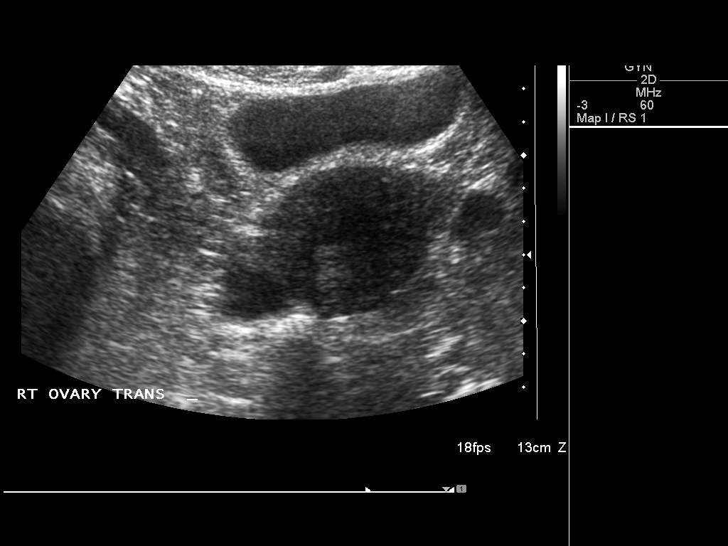
[im 24/71]
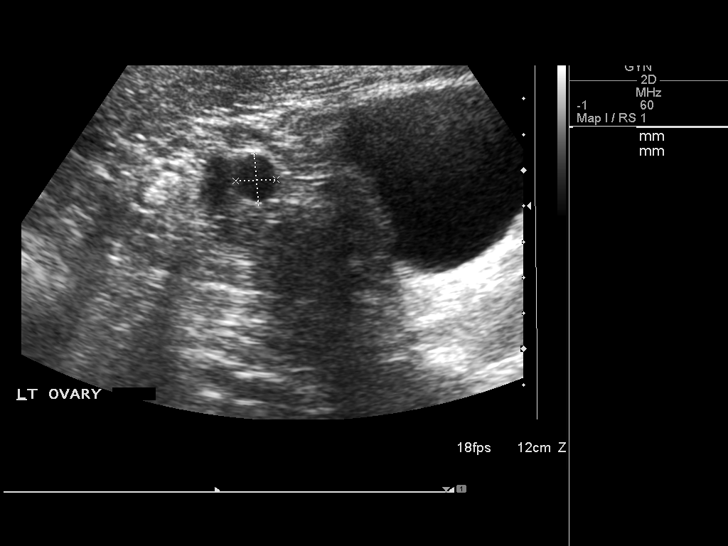
[im 30/71]
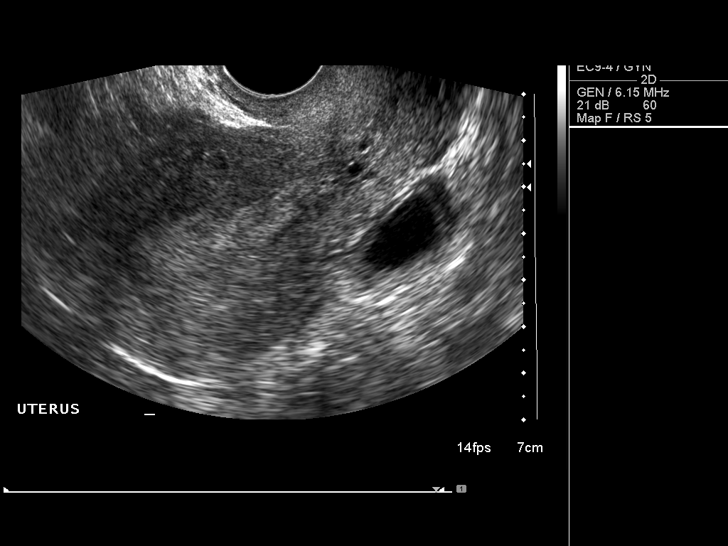
[im 36/71]
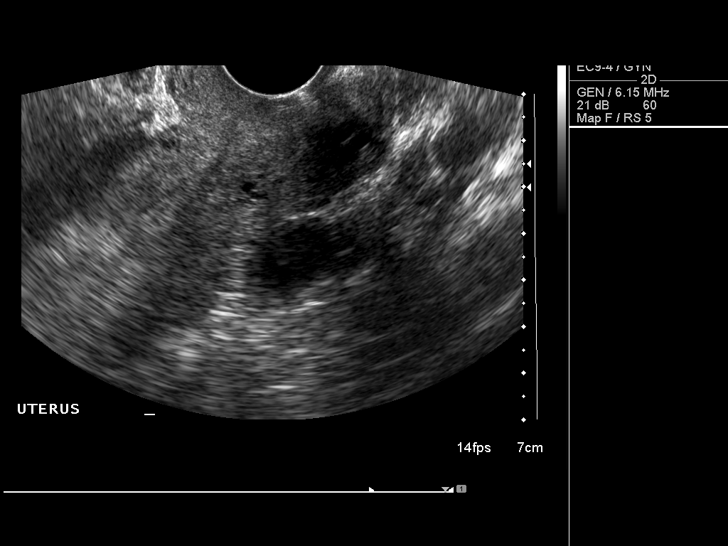
[im 41/71]
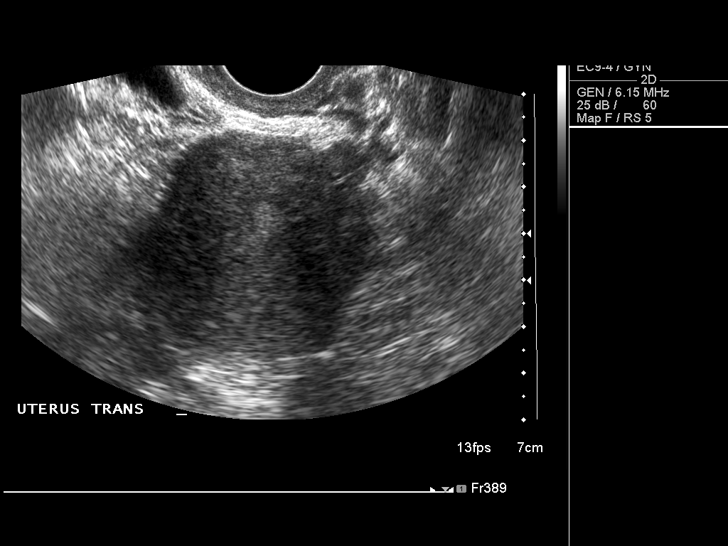
[im 47/71]
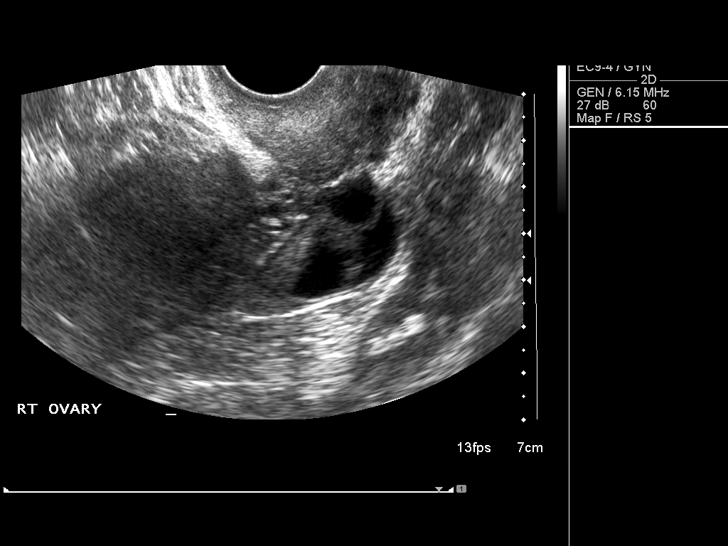
[im 53/71]
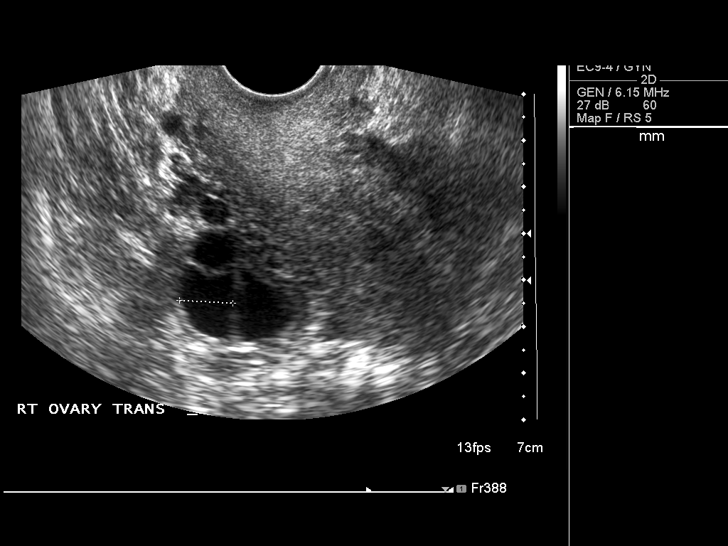
[im 59/71]
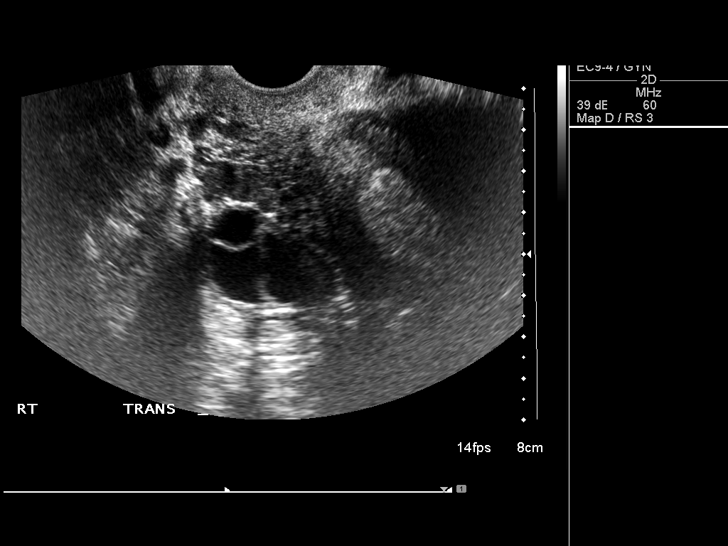
[im 65/71]
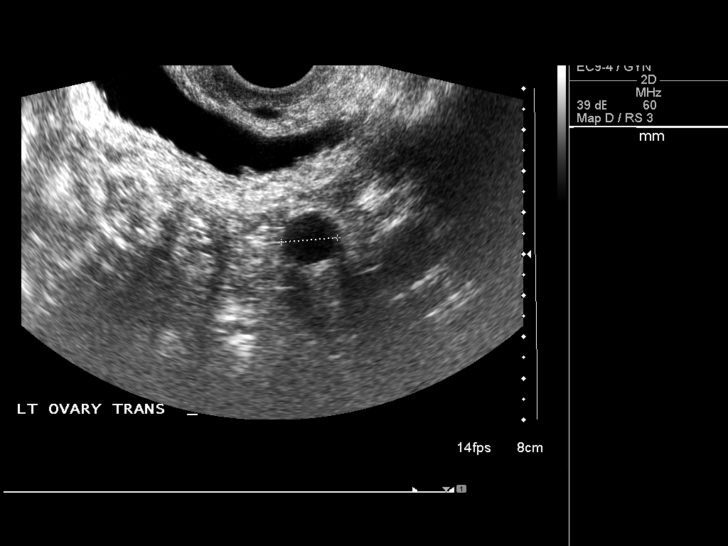
[im 71/71]
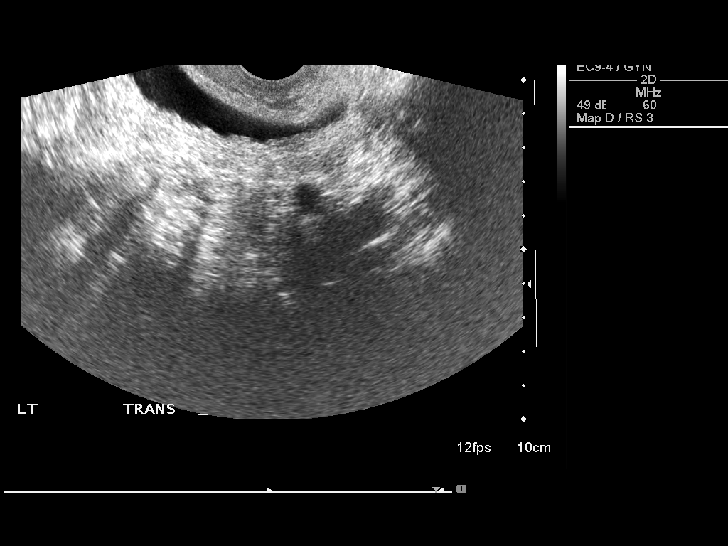

[13 of 25 positions shown; findings below may reference images not displayed]

FINDINGS: Uterus

Measurements: 9.4 x 5.1 x 4.7 cm. No fibroids or other mass
visualized.

Endometrium

Thickness: 14.8 mm.  No focal abnormality visualized.

Right ovary

Measurements: 4.2 x 2.2 x 3.6 cm. Normal appearance/no adnexal mass.
Multiple follicles.

Left ovary

Measurements: 3.2 x 2.0 x 1.8 cm. Normal appearance/no adnexal mass.
Multiple follicles.

Other findings

No abnormal free fluid.
IMPRESSION: Endometrial thickness is upper limits of normal at 14.8 mm. Given
the patient's history of dysfunctional uterine bleeding a follow-up
ultrasound should be considered in 6-12 weeks to demonstrate
resolution. If bleeding remains unresponsive to hormonal or medical
therapy, focal lesion work-up with sonohysterogram should be
considered. Endometrial biopsy should also be considered in
pre-menopausal patients at high risk for endometrial carcinoma.
(Ref: Radiological Reasoning: Algorithmic Workup of Abnormal Vaginal
Bleeding with Endovaginal Sonography and Sonohysterography. AJR
[id number]; 191:S68-73)

## 2018-01-20 ENCOUNTER — Ambulatory Visit: Payer: Self-pay | Admitting: Family Medicine

## 2018-02-03 ENCOUNTER — Encounter: Payer: Self-pay | Admitting: Family Medicine

## 2018-02-03 ENCOUNTER — Other Ambulatory Visit: Payer: Self-pay

## 2018-02-03 ENCOUNTER — Ambulatory Visit (INDEPENDENT_AMBULATORY_CARE_PROVIDER_SITE_OTHER): Payer: Self-pay | Admitting: Family Medicine

## 2018-02-03 VITALS — BP 128/72 | HR 67 | Temp 97.9°F | Ht 65.0 in | Wt 398.8 lb

## 2018-02-03 DIAGNOSIS — N938 Other specified abnormal uterine and vaginal bleeding: Secondary | ICD-10-CM

## 2018-02-03 MED ORDER — NORGESTIMATE-ETH ESTRADIOL 0.25-35 MG-MCG PO TABS: 1.0000 | ORAL_TABLET | Freq: Every day | ORAL | 3 refills | Status: DC

## 2018-02-03 NOTE — Patient Instructions (Signed)
Let's try startin the oral contraceptive pills. Let me see you back in 3 months.

## 2018-02-04 LAB — CBC
HEMATOCRIT: 40.6 % (ref 34.0–46.6)
Hemoglobin: 12.6 g/dL (ref 11.1–15.9)
MCH: 25.5 pg — ABNORMAL LOW (ref 26.6–33.0)
MCHC: 31 g/dL — ABNORMAL LOW (ref 31.5–35.7)
MCV: 82 fL (ref 79–97)
PLATELETS: 269 10*3/uL (ref 150–379)
RBC: 4.95 x10E6/uL (ref 3.77–5.28)
RDW: 16.6 % — ABNORMAL HIGH (ref 12.3–15.4)
WBC: 3.9 10*3/uL (ref 3.4–10.8)

## 2018-02-04 LAB — TSH: TSH: 0.695 u[IU]/mL (ref 0.450–4.500)

## 2018-02-04 NOTE — Progress Notes (Signed)
    CHIEF COMPLAINT / HPI: Heavy menses.  This is continued from last several years.  She bleeds about 7 days, flow is heavy.  Some clots.  Significant amount of cramping.  She has recently remarried and she and her husband are contemplating having another child so she is not really excited about doing birth control to moment.  REVIEW OF SYSTEMS: Continued weight gain.  No episodes of dizziness.  No shortness of breath chest pain.  PERTINENT  PMH / PSH: I have reviewed the patient's medications, allergies, past medical and surgical history, smoking status and updated in the EMR as appropriate. Seen by gynecologist February 2018 he said her pelvic ultrasound was consistent with PCOS.  He recommended medical management which she declined.  He also recommended diet and weight loss. Hel start her on iron supplements. Pelvic ultrasound November 2017 showed mutual stripe of 14 mm,Measurements: 4.2 x 2.2 x 3.6 cm. Normal appearance/no adnexal mass.Multiple follicles. Left ovary Measurements: 3.2 x 2.0 x 1.8 cm. Normal appearance/no adnexal mass. Multiple follicles.   OBJECTIVE: GENERAL: Well-developed morbidly obese female no acute distress ABDOMEN: Soft, positive bowel sounds nontender nondistended CV RRR RESP:normal work of breathing regular rate and rhythm  ASSESSMENT / PLAN: Please see problem oriented charting for details

## 2018-02-04 NOTE — Assessment & Plan Note (Signed)
Long discussion spending greater than 50% of our 25-minute office visit in counseling and education regarding treatment options for PCOS/DUB  Certainly her obesity is playing a part in this.  Ultimately she agreed to try oral contraceptives.  We will see her back in 2 to 3 months.

## 2018-02-09 ENCOUNTER — Encounter: Payer: Self-pay | Admitting: Family Medicine

## 2019-01-18 ENCOUNTER — Ambulatory Visit: Payer: Self-pay

## 2020-06-01 ENCOUNTER — Other Ambulatory Visit: Payer: Self-pay | Admitting: Sleep Medicine

## 2020-06-01 ENCOUNTER — Other Ambulatory Visit: Payer: Self-pay

## 2020-06-01 DIAGNOSIS — I471 Supraventricular tachycardia: Secondary | ICD-10-CM

## 2020-06-04 LAB — NOVEL CORONAVIRUS, NAA: SARS-CoV-2, NAA: NOT DETECTED

## 2020-07-10 ENCOUNTER — Ambulatory Visit: Payer: Medicaid Other | Admitting: Family Medicine

## 2020-07-10 NOTE — Progress Notes (Deleted)
   Current Outpatient Medications  Medication Instructions  . ibuprofen (ADVIL) 200 mg, Oral, Every 6 hours PRN, Takes 200-800mg  depending on severity of cramps.   . Multiple Vitamin (MULTIVITAMIN) tablet 1 tablet, Oral, Daily  . norgestimate-ethinyl estradiol (ORTHO-CYCLEN,SPRINTEC,PREVIFEM) 0.25-35 MG-MCG tablet 1 tablet, Oral, Daily   Health Maintenance Due  Topic Date Due  . Hepatitis C Screening  Never done  . COVID-19 Vaccine (1) Never done  . TETANUS/TDAP  Never done  . PAP SMEAR-Modifier  08/29/2013  . INFLUENZA VACCINE  Never done    SUBJECTIVE:  CHIEF COMPLAINT / HPI:   1. Encounter for counseling regarding immunization ***    PERTINENT  PMH / PSH: ***  Patient Active Problem List   Diagnosis Date Noted  . Anemia 11/12/2016  . 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 Obesity 08/19/2010  . CHRONIC GINGIVITIS PLAQUE INDUCED 08/26/2007    OBJECTIVE:  There were no vitals taken for this visit.  ***  ASSESSMENT/PLAN:  No problem-specific Assessment & Plan notes found for this encounter.    Melene Plan, MD [facility]

## 2020-08-15 ENCOUNTER — Other Ambulatory Visit: Payer: Self-pay

## 2020-08-15 ENCOUNTER — Ambulatory Visit (INDEPENDENT_AMBULATORY_CARE_PROVIDER_SITE_OTHER): Payer: Medicaid Other | Admitting: Family Medicine

## 2020-08-15 ENCOUNTER — Encounter: Payer: Self-pay | Admitting: Family Medicine

## 2020-08-15 VITALS — BP 126/78 | HR 79 | Wt 382.8 lb

## 2020-08-15 DIAGNOSIS — N938 Other specified abnormal uterine and vaginal bleeding: Secondary | ICD-10-CM

## 2020-08-15 DIAGNOSIS — Z23 Encounter for immunization: Secondary | ICD-10-CM | POA: Diagnosis not present

## 2020-08-15 DIAGNOSIS — Z0000 Encounter for general adult medical examination without abnormal findings: Secondary | ICD-10-CM | POA: Diagnosis present

## 2020-08-15 NOTE — Progress Notes (Signed)
    CHIEF COMPLAINT / HPI: #1.  Wants to get her first Covid shot 2.  Follow-up heavy menses.  They had seem to regulate himself some.  She still not decided about pregnancy or not.   PERTINENT  PMH / PSH: I have reviewed the patient's medications, allergies, past medical and surgical history, smoking status and updated in the EMR as appropriate.   OBJECTIVE:  BP 126/78   Pulse 79   Wt (!) 382 lb 12.8 oz (173.6 kg)   LMP 07/08/2020   SpO2 98%   BMI 63.70 kg/m   GENERAL: Well-developed female no acute distress ASSESSMENT / PLAN:   Encounter for immunization Covid vaccine given.  Dysfunctional uterine bleeding Seems improved.  Encounter for preventive health examination She has not had Pap smear in several years.  She does not want to do that today but agrees to come back and have that done soon.   Denny Levy MD

## 2020-08-15 NOTE — Assessment & Plan Note (Signed)
Covid vaccine given.

## 2020-08-15 NOTE — Assessment & Plan Note (Signed)
Seems improved ?

## 2020-08-15 NOTE — Assessment & Plan Note (Signed)
She has not had Pap smear in several years.  She does not want to do that today but agrees to come back and have that done soon.

## 2020-09-05 ENCOUNTER — Ambulatory Visit: Payer: Medicaid Other

## 2020-09-11 ENCOUNTER — Ambulatory Visit: Payer: Medicaid Other

## 2020-09-19 ENCOUNTER — Ambulatory Visit (INDEPENDENT_AMBULATORY_CARE_PROVIDER_SITE_OTHER): Payer: Medicaid Other

## 2020-09-19 ENCOUNTER — Other Ambulatory Visit: Payer: Self-pay

## 2020-09-19 DIAGNOSIS — Z23 Encounter for immunization: Secondary | ICD-10-CM

## 2020-09-19 NOTE — Progress Notes (Signed)
   Covid-19 Vaccination Clinic  Name:  Kerri Medina    MRN: [id number] DOB: 04-17-1981  09/19/2020  [name] was observed post Covid-19 immunization for 15 minutes without incident. She was provided with Vaccine Information Sheet and instruction to access the V-Safe system.   [name] was instructed to call 911 with any severe reactions post vaccine: Marland Kitchen Difficulty breathing  . Swelling of face and throat  . A fast heartbeat  . A bad rash all over body  . Dizziness and weakness   #2 Covid Vaccine administered LD without complication.

## 2022-02-25 ENCOUNTER — Encounter: Payer: Self-pay | Admitting: *Deleted

## 2022-06-12 ENCOUNTER — Telehealth: Payer: Self-pay

## 2022-06-12 NOTE — Telephone Encounter (Signed)
Patient calls nurse line requesting an antibiotic for a boil.   Patient reports she noticed the area a few days ago on her buttock. Red, swollen and painful. Patient reports today the area did "bust" and started draining. Patient reports yellowish drainage with no odor. Denies fever or chills.   Patient advised she would need to be evaluated in order to get an antibiotic. PCP is out of the office this week.   Patient reports she prefers to monitor the area. Patient advised to use sitz baths and OTC medications for discomfort.   ED precautions discussed with patient.

## 2022-06-17 NOTE — Telephone Encounter (Signed)
Patient returns call to nurse line. Patient reports that area has healed and denies fever. She states that she had to miss work last Thursday and Friday due to issue.   She is requesting a note to provide to employer to excuse absence. Patient states that she was unable to schedule appointment last week due to no clinic availability. Patient is agreeable to be seen if needed, however, we do not have any appointments until next week.   Please advise.  Talbot Grumbling, RN

## 2022-06-19 ENCOUNTER — Encounter: Payer: Self-pay | Admitting: Family Medicine

## 2022-06-19 NOTE — Telephone Encounter (Signed)
Attempted to call patient to make sure she saw her letter.   No answer and no option for VM.

## 2023-12-28 ENCOUNTER — Telehealth: Payer: Self-pay

## 2023-12-28 NOTE — Telephone Encounter (Signed)
 Patient calls nurse line requesting to schedule with PCP for physical and decreased energy levels. She is also asking about pregnancy test.   She reports that last period that she remembers was in February.   Scheduled patient for PCP's next available on 01/20/24.  Advised patient to take home pregnancy test in the meantime and call us back if positive.   Patient voices understanding.   Veronda Prude, RN

## 2024-01-20 ENCOUNTER — Encounter: Payer: Self-pay | Admitting: Family Medicine

## 2024-01-20 ENCOUNTER — Ambulatory Visit: Payer: Self-pay | Admitting: Family Medicine

## 2024-01-20 VITALS — BP 130/58 | HR 77 | Ht 67.0 in | Wt >= 6400 oz

## 2024-01-20 DIAGNOSIS — R5383 Other fatigue: Secondary | ICD-10-CM

## 2024-01-20 DIAGNOSIS — N926 Irregular menstruation, unspecified: Secondary | ICD-10-CM

## 2024-01-20 DIAGNOSIS — Z3201 Encounter for pregnancy test, result positive: Secondary | ICD-10-CM

## 2024-01-20 LAB — POCT URINE PREGNANCY: Preg Test, Ur: POSITIVE — AB

## 2024-01-20 MED ORDER — PRENATAL VITAMIN 27-0.8 MG PO TABS
1.0000 | ORAL_TABLET | Freq: Every day | ORAL | 3 refills | Status: AC
Start: 2024-01-20 — End: ?

## 2024-01-20 NOTE — Patient Instructions (Signed)
 I will send you a note about your labs. I will also call and give you your first OB appointment. CONGRATULATIONS!

## 2024-01-21 DIAGNOSIS — Z3201 Encounter for pregnancy test, result positive: Secondary | ICD-10-CM | POA: Insufficient documentation

## 2024-01-21 LAB — MICROSCOPIC EXAMINATION

## 2024-01-21 NOTE — Progress Notes (Signed)
    CHIEF COMPLAINT / HPI: Here with complaint of fatigue.  Has been going on a couple of months.  Previously felt this way when she was pregnant.  Last menstrual period she thinks was early February.  She is usually pretty regular.  She has had some occasional intermittent spotting and long episodes of bleeding in the past and has been seen for that but she has never missed periods unless she was pregnant. Has been under a tremendous amount of stress.  Finally changed jobs and that was of benefit.  Still caring for her daughter and her son who has autism.   PERTINENT  PMH / PSH: I have reviewed the patient's medications, allergies, past medical and surgical history, smoking status and updated in the EMR as appropriate. Last Pap smear 2011 Obesity with BMI of 63  OBJECTIVE:  BP (!) 130/58   Pulse 77   Ht 5\' 7"  (1.702 m)   Wt (!) 408 lb 6.4 oz (185.2 kg)   LMP 10/24/2023 (Approximate)   SpO2 100%   BMI 63.96 kg/m   GENERAL: Well-developed female no acute distress NECK: No lymphadenopathy no thyromegaly.  No carotid bruits.  Neck is supple with full range of motion. CV: Regular rate and rhythm without murmur gallop or rub LUNGS: Distant breath sounds but clear to auscultation in all lung fields.  Normal work of breathing abdomen: Obese, soft. PSYCH: AxOx4. Good eye contact.. No psychomotor retardation or agitation. Appropriate speech fluency and content. Asks and answers questions appropriately. Mood is congruent.  ASSESSMENT / PLAN:  #1.  Fatigue: Differential was initially quite broad but then her pregnancy test came back positive.  Given her own report of having only felt this fatigue before when she was pregnant, I suspect this is the etiology. 2.  Diagnosis of pregnancy.  Initial OB labs.  Prenatal vitamins.  She is non-smoker.  Reviewed medications and she is not on ACE or ARB, does not take a lot of NSAIDs.  We reviewed Tylenol  as appropriate but little else over-the-counter if  she can avoid it.  I will set her up for initial OB appointment.  35 minutes total time spent including: interview, examination, review of chart and history, development of differential diagnoses, decision about testing, discussion of treatment and management options with coordination of care No problem-specific Assessment & Plan notes found for this encounter.   Violetta Grice MD

## 2024-01-24 LAB — HGB FRACTIONATION CASCADE
Hgb A2: 2.5 % (ref 1.8–3.2)
Hgb A: 97.5 % (ref 96.4–98.8)
Hgb F: 0 % (ref 0.0–2.0)
Hgb S: 0 %

## 2024-01-24 LAB — CBC/D/PLT+RPR+RH+ABO+RUBIGG...
Antibody Screen: NEGATIVE
Basophils Absolute: 0 10*3/uL (ref 0.0–0.2)
Basos: 0 %
Bilirubin, UA: NEGATIVE
EOS (ABSOLUTE): 0.1 10*3/uL (ref 0.0–0.4)
Eos: 2 %
Glucose, UA: NEGATIVE
HCV Ab: NONREACTIVE
HIV Screen 4th Generation wRfx: NONREACTIVE
Hematocrit: 38.8 % (ref 34.0–46.6)
Hemoglobin: 12.3 g/dL (ref 11.1–15.9)
Hepatitis B Surface Ag: NEGATIVE
Immature Grans (Abs): 0 10*3/uL (ref 0.0–0.1)
Immature Granulocytes: 0 %
Ketones, UA: NEGATIVE
Lymphocytes Absolute: 1.4 10*3/uL (ref 0.7–3.1)
Lymphs: 29 %
MCH: 27.2 pg (ref 26.6–33.0)
MCHC: 31.7 g/dL (ref 31.5–35.7)
MCV: 86 fL (ref 79–97)
Monocytes Absolute: 0.4 10*3/uL (ref 0.1–0.9)
Monocytes: 7 %
Neutrophils Absolute: 3.1 10*3/uL (ref 1.4–7.0)
Neutrophils: 62 %
Nitrite, UA: NEGATIVE
Platelets: 316 10*3/uL (ref 150–450)
RBC: 4.52 x10E6/uL (ref 3.77–5.28)
RDW: 14.5 % (ref 11.7–15.4)
RPR Ser Ql: NONREACTIVE
Rh Factor: POSITIVE
Rubella Antibodies, IGG: 3.24 {index} (ref >0.99)
Specific Gravity, UA: 1.027 (ref 1.005–1.030)
Urobilinogen, Ur: 0.2 mg/dL (ref 0.2–1.0)
WBC: 5 10*3/uL (ref 3.4–10.8)
pH, UA: 5.5 (ref 5.0–7.5)

## 2024-01-24 LAB — URINE CULTURE, OB REFLEX

## 2024-01-24 LAB — MICROSCOPIC EXAMINATION
Bacteria, UA: NONE SEEN
Casts: NONE SEEN /LPF
Epithelial Cells (non renal): >10 /HPF — AB (ref 0–10)

## 2024-01-24 LAB — HCV INTERPRETATION

## 2024-01-29 ENCOUNTER — Ambulatory Visit (INDEPENDENT_AMBULATORY_CARE_PROVIDER_SITE_OTHER): Payer: Self-pay | Admitting: Student

## 2024-01-29 ENCOUNTER — Encounter: Payer: Self-pay | Admitting: Student

## 2024-01-29 ENCOUNTER — Other Ambulatory Visit (HOSPITAL_COMMUNITY)
Admission: RE | Admit: 2024-01-29 | Discharge: 2024-01-29 | Disposition: A | Discharge status: Home / Self Care | Payer: Self-pay | Source: Ambulatory Visit | Attending: Family Medicine | Admitting: Family Medicine

## 2024-01-29 VITALS — BP 130/56 | HR 79 | Wt >= 6400 oz

## 2024-01-29 DIAGNOSIS — Z3481 Encounter for supervision of other normal pregnancy, first trimester: Secondary | ICD-10-CM

## 2024-01-29 DIAGNOSIS — Z3491 Encounter for supervision of normal pregnancy, unspecified, first trimester: Secondary | ICD-10-CM | POA: Insufficient documentation

## 2024-01-29 DIAGNOSIS — O0991 Supervision of high risk pregnancy, unspecified, first trimester: Secondary | ICD-10-CM | POA: Insufficient documentation

## 2024-01-29 DIAGNOSIS — Z3A13 13 weeks gestation of pregnancy: Secondary | ICD-10-CM

## 2024-01-29 MED ORDER — ASPIRIN 81 MG PO TBEC: 81.0000 mg | DELAYED_RELEASE_TABLET | Freq: Every day | ORAL | 12 refills | Status: DC

## 2024-01-29 NOTE — Progress Notes (Signed)
 Patient Name: Kerri Medina Date of Birth: 1981-02-09 [facility] Initial Prenatal Visit  Kerri Medina is a 43 y.o. year old [phone] at Unknown who presents for her initial prenatal visit.  Pregnancy is not planned She reports none. She is taking a prenatal vitamin.  She denies pelvic pain or vaginal bleeding.   Pregnancy Dating: The patient is dated by LMP.  LMP: 10/24/2023 Period is certain:  No.  Periods were regular:  No.  LMP was a typical period:  Yes.  Using hormonal contraception in 3 months prior to conception: No  Lab Review: Blood type: O Rh Status: + Antibody screen: Negative HIV: Negative RPR: Negative Hemoglobin electrophoresis reviewed: Yes Results of OB urine culture are: Negative Rubella: Immune Hep C Ab: Negative Varicella status is Immune  PMH: Reviewed and as detailed below: HTN: No  Gestational Hypertension/preeclampsia: No  Type 1 or 2 Diabetes: No  Depression:  No  Seizure disorder:  No VTE: No ,  History of STI Yes, trich as 43 year old Abnormal Pap smear:  No, Genital herpes simplex:  No   PSH: Gynecologic Surgery:  no Surgical history reviewed, notable for: none  Obstetric History: Obstetric history tab updated and reviewed.  Summary of prior pregnancies: put in history Cesarean delivery: No  Gestational Diabetes:  No Hypertension in pregnancy: No History of preterm birth: No History of LGA/SGA infant:  Yes 4536 History of shoulder dystocia: No possibly?? States baby was "sunny side up" and had to have OB provide put hand in and turn around Indications for referral were reviewed, and the patient has no obstetric indications for referral to High Risk OB Clinic at this time.   Social History: Partner's name: Amado Juneau  Tobacco use: No, stopped in Feb Alcohol use:  No Other substance use:  No  Current Medications:  Prenatal vitamin Reviewed and appropriate in pregnancy.   Genetic and Infection Screen: Flow  Sheet Updated Yes  Prenatal Exam: Gen: Well nourished, well developed.  No distress.  Vitals noted. HEENT: Normocephalic, atraumatic.  Neck supple without cervical lymphadenopathy, thyromegaly or thyroid nodules.  Fair dentition. CV: RRR no murmur, gallops or rubs Lungs: CTA B.  Normal respiratory effort without wheezes or rales. Abd: soft, NTND. +BS.  Uterus not appreciated above pelvis. GU: Normal external female genitalia without lesions.  Nl vaginal, well rugated without lesions. White discharge.  Bimanual exam: No adnexal mass or TTP. No CMT.   Ext: No clubbing, cyanosis or edema. Psych: Normal grooming and dress.  Not depressed or anxious appearing.  Normal thought content and process without flight of ideas or looseness of associations  Chaperone CMA Elonda Hale  Fetal heart tones: not attempted given poor dating*  Assessment/Plan:  Kerri Medina is a 43 y.o. [phone] at Unknown who presents to initiate prenatal care. She is doing well.  Current pregnancy issues include obesity, AMA  Routine prenatal care: As dating is not reliable, a dating ultrasound has been ordered. Dating tab updated. Pre-pregnancy weight updated. Expected weight gain this pregnancy is 11-20 pounds  Prenatal labs reviewed, notable for normal. Indications for referral to [facility] were reviewed and the patient does meet criteria for referral.  Medication list reviewed and updated.  Recommended patient see a dentist for regular care.  Bleeding and pain precautions reviewed. Importance of prenatal vitamins reviewed.  Genetic screening offered. Patient opted for: Plan for genetic screening with OB f/u The patient has the following indications for aspirinto begin 81 mg at  12-16 weeks: One high risk condition: no single high risk condition  MORE than one moderate risk condition: obesity, Age 52 or older , identifies as Tree surgeon , more than 10 years since last pregnancy , and prior adverse pregnancy  outcome  Aspirin was  recommended today based upon above risk factors (one high risk condition or more than one moderate risk factor)  The patient will be age 1 or over at time of delivery. Referral to genetic counseling was offered today.  The patient has the following risk factors for preexisting diabetes: BMI >25 and history of infant >4,000 g. An early 1 hour glucose tolerance test was ordered. PHQ-9 forms completed, problems noted: No     01/20/2024   10:13 AM 08/15/2020   11:08 AM 02/03/2018   10:44 AM 11/12/2016    9:01 AM 08/20/2016   10:37 AM  Depression screen PHQ 2/9  Decreased Interest 0 0 0 0 0  Down, Depressed, Hopeless 0 0 0 0 0  PHQ - 2 Score 0 0 0 0 0  Altered sleeping 0 0  0   Tired, decreased energy 0 0  2   Change in appetite 0 0  1   Feeling bad or failure about yourself  0 0  0   Trouble concentrating 0 0  0   Moving slowly or fidgety/restless 0 0  0   Suicidal thoughts 0 0  0   PHQ-9 Score 0 0  3   Difficult doing work/chores Not difficult at all         2. Pregnancy issues include the following which were addressed today:  AMA-42, discussed referral to high risk OB for following pregnancy Hx of stillborn-referring to high risk OB Obtained pap smear with G/C screen today 1 hour GTT ordered, future Dating ultrasound ordered for confirmation of dates given unsure of LMP   Follow up 4 weeks for next prenatal visit.

## 2024-01-29 NOTE — Patient Instructions (Addendum)
 We need to refer you to [facility] for high risk Please call [facility] to schedule an appointment [phone] Recommend starting aspirin 81 mg daily Need to come back for 1 hour glucose test We are ordering a dating ultrasound Recommend obtaining genetic screenings at [facility] appointment I will let you know about pap smear  Prenatal Classes Go to [email or web address] for more information on the pregnancy and child birth classes that [place] has to offer.   Pregnancy Related Return Precautions The follow are signs/symptoms that are abnormal in pregnancy and may require further evaluation by a physician: Go to the MAU at [facility] at [facility] if: You have cramping/contractions that do not go away with drinking water, especially if they are lasting 30 seconds to 1.5 minutes, coming and going every 5-10 minutes for an hour or more, or are getting stronger and you cannot walk or talk while having a contraction/cramp. Your water breaks.  Sometimes it is a big gush of fluid, sometimes it is just a trickle that keeps getting your underwear wet or running down your legs You have vaginal bleeding.     These are all concerning in pregnancy and if you have any of these I recommend you call your PCP and present to the Maternity Admissions Unit (map below) for further evaluation.  For any pregnancy-related emergencies, please go to the Maternity Admissions Unit in the [facility] at [facility]. You will use hospital Entrance C.    Our clinic number is [phone].

## 2024-02-01 ENCOUNTER — Other Ambulatory Visit: Payer: Self-pay

## 2024-02-02 ENCOUNTER — Other Ambulatory Visit (INDEPENDENT_AMBULATORY_CARE_PROVIDER_SITE_OTHER): Payer: Self-pay

## 2024-02-02 DIAGNOSIS — Z3A13 13 weeks gestation of pregnancy: Secondary | ICD-10-CM

## 2024-02-02 LAB — CYTOLOGY - PAP
Chlamydia: NEGATIVE
Comment: NEGATIVE
Comment: NEGATIVE
Comment: NORMAL
Diagnosis: NEGATIVE
High risk HPV: NEGATIVE
Neisseria Gonorrhea: NEGATIVE

## 2024-02-02 LAB — POCT 1 HR PRENATAL GLUCOSE: Glucose 1 Hr Prenatal, POC: 146 mg/dL

## 2024-02-03 ENCOUNTER — Ambulatory Visit: Payer: Self-pay | Admitting: Student

## 2024-02-03 DIAGNOSIS — A599 Trichomoniasis, unspecified: Secondary | ICD-10-CM

## 2024-02-04 ENCOUNTER — Other Ambulatory Visit: Payer: Self-pay

## 2024-02-04 MED ORDER — METRONIDAZOLE 500 MG PO TABS: 500.0000 mg | ORAL_TABLET | Freq: Two times a day (BID) | ORAL | 0 refills | Status: AC

## 2024-02-04 NOTE — Telephone Encounter (Signed)
 Called pt x3, no response. Left VM to return call or check mychart messaged.  If patient returns call please let her know that her Pap smear was normal but did show trichomonas.  I will be sending in metronidazole to take twice a day for 7 days.

## 2024-02-04 NOTE — Telephone Encounter (Signed)
 Patient calls back to nurse line.   Patient informed of test results. Patient advised of positive trichomonas and the need for treatment. Medication to pharmacy discussed with patient. Advised all partner(s) will need to be treated as well. Advised to refrain from sexual activity until all is treated.   Patient voiced understanding.   Patient rescheduled lab apt for 5/20.

## 2024-02-04 NOTE — Addendum Note (Signed)
 Addended by: Ki Corbo on: 02/04/2024 08:05 AM   Modules accepted: Orders

## 2024-02-05 ENCOUNTER — Ambulatory Visit (HOSPITAL_COMMUNITY)
Admission: RE | Admit: 2024-02-05 | Discharge: 2024-02-05 | Disposition: A | Discharge status: Home / Self Care | Payer: Self-pay | Source: Ambulatory Visit | Attending: Family Medicine | Admitting: Family Medicine

## 2024-02-05 DIAGNOSIS — N83292 Other ovarian cyst, left side: Secondary | ICD-10-CM | POA: Insufficient documentation

## 2024-02-05 DIAGNOSIS — O3481 Maternal care for other abnormalities of pelvic organs, first trimester: Secondary | ICD-10-CM | POA: Insufficient documentation

## 2024-02-05 DIAGNOSIS — Z3687 Encounter for antenatal screening for uncertain dates: Secondary | ICD-10-CM | POA: Insufficient documentation

## 2024-02-05 DIAGNOSIS — Z3A13 13 weeks gestation of pregnancy: Secondary | ICD-10-CM | POA: Insufficient documentation

## 2024-02-09 ENCOUNTER — Encounter: Payer: Self-pay | Admitting: Family Medicine

## 2024-02-09 ENCOUNTER — Other Ambulatory Visit (INDEPENDENT_AMBULATORY_CARE_PROVIDER_SITE_OTHER): Payer: Self-pay

## 2024-02-09 DIAGNOSIS — Z3A13 13 weeks gestation of pregnancy: Secondary | ICD-10-CM

## 2024-02-09 LAB — POCT CBG (FASTING - GLUCOSE)-MANUAL ENTRY: Glucose Fasting, POC: 122 mg/dL — AB (ref 70–99)

## 2024-02-10 LAB — GESTATIONAL GLUCOSE TOLERANCE
Glucose, Fasting: 109 mg/dL — ABNORMAL HIGH (ref 70–94)
Glucose, GTT - 1 Hour: 141 mg/dL (ref 70–179)
Glucose, GTT - 2 Hour: 118 mg/dL (ref 70–154)
Glucose, GTT - 3 Hour: 81 mg/dL (ref 70–139)

## 2024-02-29 ENCOUNTER — Encounter: Payer: Self-pay | Admitting: Obstetrics and Gynecology

## 2024-02-29 ENCOUNTER — Ambulatory Visit: Payer: Self-pay | Admitting: Obstetrics and Gynecology

## 2024-02-29 VITALS — BP 144/83 | HR 80 | Wt >= 6400 oz

## 2024-02-29 DIAGNOSIS — O0991 Supervision of high risk pregnancy, unspecified, first trimester: Secondary | ICD-10-CM

## 2024-02-29 DIAGNOSIS — O99210 Obesity complicating pregnancy, unspecified trimester: Secondary | ICD-10-CM | POA: Insufficient documentation

## 2024-02-29 DIAGNOSIS — O99212 Obesity complicating pregnancy, second trimester: Secondary | ICD-10-CM

## 2024-02-29 DIAGNOSIS — O09522 Supervision of elderly multigravida, second trimester: Secondary | ICD-10-CM

## 2024-02-29 DIAGNOSIS — Z1332 Encounter for screening for maternal depression: Secondary | ICD-10-CM

## 2024-02-29 DIAGNOSIS — Z8759 Personal history of other complications of pregnancy, childbirth and the puerperium: Secondary | ICD-10-CM | POA: Insufficient documentation

## 2024-02-29 DIAGNOSIS — Z3A18 18 weeks gestation of pregnancy: Secondary | ICD-10-CM

## 2024-02-29 DIAGNOSIS — O09529 Supervision of elderly multigravida, unspecified trimester: Secondary | ICD-10-CM | POA: Insufficient documentation

## 2024-02-29 DIAGNOSIS — O0992 Supervision of high risk pregnancy, unspecified, second trimester: Secondary | ICD-10-CM

## 2024-02-29 NOTE — Progress Notes (Signed)
   PRENATAL VISIT NOTE  Subjective:  Kerri Medina is a 43 y.o. [phone] at [redacted]w[redacted]d being seen today for ongoing prenatal care.  Patient is transferring care from [facility] medicine du to elevated BMI and history of IUFD. She is currently monitored for the following issues for this high-risk pregnancy and has Obesity; Supervision of high risk pregnancy in first trimester; AMA (advanced maternal age) multigravida 35+; Maternal obesity affecting pregnancy, antepartum; and History of IUFD on their problem list.  Patient reports no complaints.  Contractions: Not present. Vag. Bleeding: None.   . Denies leaking of fluid.   The following portions of the patient's history were reviewed and updated as appropriate: allergies, current medications, past family history, past medical history, past social history, past surgical history and problem list.   Objective:    Vitals:   02/29/24 1135 02/29/24 1139  BP: (!) 144/83 (!) 144/83  Pulse: 81 80  Weight: (!) 416 lb (188.7 kg)     Fetal Status:  Fetal Heart Rate (bpm): 150        General: Alert, oriented and cooperative. Patient is in no acute distress.  Skin: Skin is warm and dry. No rash noted.   Cardiovascular: Normal heart rate noted  Respiratory: Normal respiratory effort, no problems with respiration noted  Abdomen: Soft, gravid, appropriate for gestational age.  Pain/Pressure: Absent     Pelvic: Cervical exam deferred        Extremities: Normal range of motion.  Edema: None  Mental Status: Normal mood and affect. Normal behavior. Normal judgment and thought content.   Assessment and Plan:  Pregnancy: [id number] at [redacted]w[redacted]d 1. Supervision of high risk pregnancy in first trimester (Primary) Patient is doing well without complaints AFP and genetic screening test today Anatomy ultrasound ordered  2. [redacted] weeks gestation of pregnancy   3. Multigravida of advanced maternal age in second trimester Follow up NIPS  4. Obesity affecting pregnancy,  antepartum, unspecified obesity type Continue ASA Patient failed 1 hour glucola and passed 3 hour early glucola Repeat testing at 26-28 weeks  5. History of IUFD Antenatal surveillance with MFM per  guidelines later in pregnancy  Preterm labor symptoms and general obstetric precautions including but not limited to vaginal bleeding, contractions, leaking of fluid and fetal movement were reviewed in detail with the patient. Please refer to After Visit Summary for other counseling recommendations.   Return in about 4 weeks (around 03/28/2024) for in person, ROB, High risk.  No future appointments.  Verlyn Goad, MD

## 2024-03-03 LAB — AFP, SERUM, OPEN SPINA BIFIDA
AFP MoM: 1.09
AFP Value: 33.4 ng/mL
Gest. Age on Collection Date: 18.2 wk
Maternal Age At EDD: 42.6 a
OSBR Risk 1 IN: 10000
Test Results:: NEGATIVE
Weight: 416 [lb_av]

## 2024-03-05 LAB — PANORAMA PRENATAL TEST FULL PANEL:PANORAMA TEST PLUS 5 ADDITIONAL MICRODELETIONS: FETAL FRACTION: 3

## 2024-03-10 ENCOUNTER — Ambulatory Visit: Payer: Self-pay | Admitting: Obstetrics and Gynecology

## 2024-03-10 DIAGNOSIS — D563 Thalassemia minor: Secondary | ICD-10-CM | POA: Insufficient documentation

## 2024-03-10 LAB — HORIZON CUSTOM: REPORT SUMMARY: POSITIVE — AB

## 2024-03-23 ENCOUNTER — Ambulatory Visit: Payer: Self-pay

## 2024-03-23 ENCOUNTER — Other Ambulatory Visit: Payer: Self-pay

## 2024-03-28 ENCOUNTER — Encounter: Payer: Self-pay | Admitting: Obstetrics and Gynecology

## 2024-03-30 NOTE — Progress Notes (Deleted)
   PRENATAL VISIT NOTE  Subjective:  Kerri Medina is a 43 y.o. [phone] at [redacted]w[redacted]d being seen today for ongoing prenatal care.  She is currently monitored for the following issues for this {Blank single:19197::high-risk,low-risk} pregnancy and has Obesity; Supervision of high risk pregnancy in first trimester; AMA (advanced maternal age) multigravida 35+; Maternal obesity affecting pregnancy, antepartum; History of IUFD; and Alpha thalassemia silent carrier on their problem list.  Patient reports {sx:[id number]}.   .  .   . Denies leaking of fluid.   The following portions of the patient's history were reviewed and updated as appropriate: allergies, current medications, past family history, past medical history, past social history, past surgical history and problem list.   Objective:    There were no vitals filed for this visit.  Fetal Status:           General: Alert, oriented and cooperative. Patient is in no acute distress.  Skin: Skin is warm and dry. No rash noted.   Cardiovascular: Normal heart rate noted  Respiratory: Normal respiratory effort, no problems with respiration noted  Abdomen: Soft, gravid, appropriate for gestational age.        Pelvic: Cervical exam deferred        Extremities: Normal range of motion.     Mental Status: Normal mood and affect. Normal behavior. Normal judgment and thought content.   Assessment and Plan:  Pregnancy: [id number] at [redacted]w[redacted]d  1. Supervision of high risk pregnancy in second trimester (Primary) Patient doing well, feeling regular fetal movement  BP*** FHR, FH appropriate  2. [redacted] weeks gestation of pregnancy Anticipatory guidance about next visits/weeks of pregnancy given.  04/06/24 anatomy scan   3. Multigravida of advanced maternal age in second trimester 4. BMI 60.0-69.9, adult (HCC) LR NIPT Continue ASA  Preterm labor symptoms and general obstetric precautions including but not limited to vaginal bleeding, contractions, leaking of  fluid and fetal movement were reviewed in detail with the patient. Please refer to After Visit Summary for other counseling recommendations.   No follow-ups on file.  Future Appointments  Date Time Provider Department Center  03/31/2024  4:10 PM Stefan Karen E, PA-C [facility] None  04/06/2024  9:00 AM [facility] GENETIC COUNSELING RM [facility]  04/06/2024 10:00 AM [facility] PROVIDER 1 [facility]  04/06/2024 10:30 AM [facility] US1 [facility]    Jorene FORBES Moats, PA-C

## 2024-03-31 ENCOUNTER — Telehealth: Payer: Self-pay

## 2024-03-31 ENCOUNTER — Encounter: Payer: Self-pay | Admitting: Physician Assistant

## 2024-03-31 DIAGNOSIS — O09522 Supervision of elderly multigravida, second trimester: Secondary | ICD-10-CM

## 2024-03-31 DIAGNOSIS — Z3A20 20 weeks gestation of pregnancy: Secondary | ICD-10-CM

## 2024-03-31 DIAGNOSIS — O0992 Supervision of high risk pregnancy, unspecified, second trimester: Secondary | ICD-10-CM

## 2024-03-31 DIAGNOSIS — Z6841 Body Mass Index (BMI) 40.0 and over, adult: Secondary | ICD-10-CM

## 2024-04-06 ENCOUNTER — Ambulatory Visit (HOSPITAL_BASED_OUTPATIENT_CLINIC_OR_DEPARTMENT_OTHER): Payer: Self-pay

## 2024-04-06 ENCOUNTER — Ambulatory Visit: Payer: Self-pay | Attending: Obstetrics and Gynecology | Admitting: Maternal & Fetal Medicine

## 2024-04-06 ENCOUNTER — Other Ambulatory Visit: Payer: Self-pay | Admitting: *Deleted

## 2024-04-06 VITALS — BP 121/68 | HR 75

## 2024-04-06 DIAGNOSIS — E669 Obesity, unspecified: Secondary | ICD-10-CM | POA: Diagnosis not present

## 2024-04-06 DIAGNOSIS — O26892 Other specified pregnancy related conditions, second trimester: Secondary | ICD-10-CM | POA: Insufficient documentation

## 2024-04-06 DIAGNOSIS — Z148 Genetic carrier of other disease: Secondary | ICD-10-CM | POA: Diagnosis not present

## 2024-04-06 DIAGNOSIS — O09522 Supervision of elderly multigravida, second trimester: Secondary | ICD-10-CM

## 2024-04-06 DIAGNOSIS — O99212 Obesity complicating pregnancy, second trimester: Secondary | ICD-10-CM

## 2024-04-06 DIAGNOSIS — Z363 Encounter for antenatal screening for malformations: Secondary | ICD-10-CM | POA: Diagnosis not present

## 2024-04-06 DIAGNOSIS — O09299 Supervision of pregnancy with other poor reproductive or obstetric history, unspecified trimester: Secondary | ICD-10-CM

## 2024-04-06 DIAGNOSIS — R030 Elevated blood-pressure reading, without diagnosis of hypertension: Secondary | ICD-10-CM | POA: Insufficient documentation

## 2024-04-06 DIAGNOSIS — O09292 Supervision of pregnancy with other poor reproductive or obstetric history, second trimester: Secondary | ICD-10-CM

## 2024-04-06 DIAGNOSIS — Z3A21 21 weeks gestation of pregnancy: Secondary | ICD-10-CM

## 2024-04-06 DIAGNOSIS — Z8759 Personal history of other complications of pregnancy, childbirth and the puerperium: Secondary | ICD-10-CM

## 2024-04-06 DIAGNOSIS — O99210 Obesity complicating pregnancy, unspecified trimester: Secondary | ICD-10-CM

## 2024-04-06 DIAGNOSIS — O132 Gestational [pregnancy-induced] hypertension without significant proteinuria, second trimester: Secondary | ICD-10-CM | POA: Diagnosis not present

## 2024-04-06 DIAGNOSIS — O99112 Other diseases of the blood and blood-forming organs and certain disorders involving the immune mechanism complicating pregnancy, second trimester: Secondary | ICD-10-CM

## 2024-04-06 DIAGNOSIS — D563 Thalassemia minor: Secondary | ICD-10-CM | POA: Diagnosis not present

## 2024-04-06 DIAGNOSIS — Z3689 Encounter for other specified antenatal screening: Secondary | ICD-10-CM

## 2024-04-06 DIAGNOSIS — O358XX0 Maternal care for other (suspected) fetal abnormality and damage, not applicable or unspecified: Secondary | ICD-10-CM

## 2024-04-06 DIAGNOSIS — O0991 Supervision of high risk pregnancy, unspecified, first trimester: Secondary | ICD-10-CM

## 2024-04-06 NOTE — Progress Notes (Signed)
 Patient information  Patient Name: Kerri Medina  Patient MRN:   [id number]  Referring practice: MFM Referring Provider: [facility]  Problem List   Patient Active Problem List   Diagnosis Date Noted   Alpha thalassemia silent carrier 03/10/2024   AMA (advanced maternal age) multigravida 35+ 02/29/2024   Maternal obesity affecting pregnancy, antepartum 02/29/2024   History of IUFD 02/29/2024   Supervision of high risk pregnancy in first trimester 01/29/2024   Obesity 08/19/2010   Maternal Fetal Medicine Consult Kerri Medina is a 43 y.o. [id number] at [redacted]w[redacted]d here for ultrasound and consultation. She had Low risk aneuploidy screening of a female fetus. Carrier screening was positive for alpha thal silent carrier. Maternal serum AFP was negative. She has no acute concerns.   Today we focused on the following:   Elevated BMI: I discussed the potential complications associated with obesity in pregnancy.  These complications include but are not limited to increased risk of excessive maternal weight gain, fetal growth abnormalities, fetal congenital disorders, inability to visualize fetal anatomic structures on ultrasound, gestational diabetes, hypertensive disorders of pregnancy, operative birth including cesarean delivery or assisted vaginal delivery, delayed wound healing and many long-term health complications.  I discussed the need for continued growth ultrasounds and possibly antenatal testing depending upon how the pregnancy course progresses.  Maternal weight gain should be limited to 10 to 20 pounds during the pregnancy.  While normal weight loss may occur during the first and early second trimester, efforts to actively lose weight with the use of medication is not recommended during pregnancy.  A whole food diet and regular exercise of at least 15 to 30 minutes of moderately strenuous activity is recommended in the absence of any contraindications. Weight loss with the use of  medications is not recommended during pregnancy.    Elevated BP: The patient has had a few elevated blood pressures during the pregnancy but nothing sustained in the hypertensive range.  She will continue to have her blood pressure assessed at future OB visits.  Today her blood pressure is 121/68.  She is compliant with aspirin  81 mg daily for preeclampsia risk reduction.  History of IUFD: Patient had a fetal demise in 2002, 39 weeks 1 day for unknown reasons.  I reviewed the placental pathology which showed a normal placenta and no evidence of infection.  The patient was never given a cause for the term demise and does not recall any specific workup.  I discussed that antiphospholipid antibodies should be ordered by the referring provider.  Ultrasound findings Single intrauterine pregnancy at 21w 5d  Fetal cardiac activity:  Observed and appears normal. Presentation: Transverse, head to maternal left. The anatomic structures that were well seen appear normal without evidence of soft markers. Due to poor acoustic windows some structures remain suboptimally visualized. Fetal biometry shows the estimated fetal weight at the 41 percentile.  Amniotic fluid: Within normal limits.  MVP: 6.24 cm. Placenta: Posterior. Adnexa: Within normal limits. Cervical length: 3.5 cm.  There are limitations of prenatal ultrasound such as the inability to detect certain abnormalities due to poor visualization. Various factors such as fetal position, gestational age and maternal body habitus may increase the difficulty in visualizing the fetal anatomy.    Recommendations - Anatomy ultrasound was done today with the above findings (see report). - Aspirin  81-162 mg continued throughout the pregnancy for preeclampsia prophylaxis. - Baseline labs: CMP, CBC, urine protein creatinine ratio if not previously completed.  Due to her  history of demise antiphospholipid antibodies should be ordered by the referring provider. -  Blood pressure goal of < 140 systolic and < 90 diastolic. Antihypertensive medication should be added/adjusted until BP goal is achieved.  - Early Glucola screening (or A1c of patient declines). - Fetal echocardiogram -this should be considered if fetal cardiac anatomy is not completely assessed at the next ultrasound. - Serial growth ultrasounds every 4 weeks starting at 24 to 28 weeks until delivery. - Antenatal testing (usually weekly BPP or NST) weekly at 34 weeks until delivery. - Delivery likely around EDD or sooner if indicated.  Review of Systems: A review of systems was performed and was negative except per HPI   Past Obstetrical History:  OB History  Gravida Para Term Preterm AB Living  4 3 3   2   SAB IAB Ectopic Multiple Live Births      2    # Outcome Date GA Lbr Len/2nd Weight Sex Type Anes PTL Lv  4 Current           3 Term 2012 [redacted]w[redacted]d  7 lb (3.175 kg) M Vag-Spont   LIV  2 Term 2008 [redacted]w[redacted]d  10 lb (4.536 kg) F Vag-Spont   LIV     Birth Comments: episiotomy  1 Term 2002 [redacted]w[redacted]d       FD     Past Medical History:  Past Medical History:  Diagnosis Date   Medical history non-contributory      Past Surgical History:    Past Surgical History:  Procedure Laterality Date   NO PAST SURGERIES       Home Medications:   Current Outpatient Medications on File Prior to Visit  Medication Sig Dispense Refill   aspirin  EC 81 MG tablet Take 1 tablet (81 mg total) by mouth daily. Swallow whole. 30 tablet 12   Prenatal Vit-Fe Fumarate-FA (PRENATAL VITAMIN) 27-0.8 MG TABS Take 1 tablet by mouth daily. 90 tablet 3   No current facility-administered medications on file prior to visit.      Allergies:   No Known Allergies   Physical Exam:   Vitals:   04/06/24 0947  BP: 121/68  Pulse: 75   Sitting comfortably on the sonogram table Nonlabored breathing Normal rate and rhythm Abdomen is nontender  Thank you for the opportunity to be involved with this patient's care. Please  let us  know if we can be of any further assistance.   30 minutes of time was spent reviewing the patient's chart including labs, imaging and documentation.  At least 50% of this time was spent with direct patient care discussing the diagnosis, management and prognosis of her care.  [name] MFM,    04/06/2024  11:30 AM

## 2024-04-06 NOTE — Progress Notes (Cosign Needed Addendum)
 [facility] at [facility] [address], [address] Phone:  [phone]   Fax:  [phone]      In-Person Genetic Counseling Clinic Note:   I spoke with 43 y.o. Kerri Medina today to discuss her carrier screening results. She was referred by Alger Gong, MD.   Pregnancy History:    [id number]. EGA: [redacted]w[redacted]d by US . EDD: 08/12/2024. Kerri Medina has two healthy children. Her first pregnancy was a stillborn female, patient reports no known underlying cause. Stillbirth can be caused by various factors including genetic conditions. Without medical reports, recurrence risk is difficult to estimate. Denies major personal health concerns. Denies bleeding, infections, and fevers in this pregnancy. Reports drinking alcohol and smoking cigarettes before she knew she was pregnant and stopped immediately. Denies drug use.   Family History:    A three-generation pedigree was created and scanned into Epic under the Media tab.  Patient reports her 41 yo son was diagnosed with autism. He is mostly non-verbal. He had not had genetic testing. We also discussed that we are unable to directly test for autism in a pregnancy. Genetic testing for individuals with a clinical diagnosis of autism yields an explanation in only about 20% of cases, and the remaining 80% of cases are left with unknown etiology. Having an affected family member may increase the chance that Haileyann's children will have autism; however, without genetic testing performed on affected family members, it is difficult to assess risk to the pregnancy and other family members. The risk may be up to 50% in the case of an identified genetic cause. We discussed it is recommended her son have an evaluation with pediatric genetics. We also discussed and offered screening for fragile X syndrome, which is one of the most common causes of inherited intellectual disability and autism in males. Fragile X syndrome affects  females as well. Kerri Medina declined screening today but is considering her options.  Patient's mother d. 67 yo from pancreatic cancer, no genetic testing performed. Different types of cancer can have a hereditary component that increases an individual's susceptibility to develop cancer; however, most cancers occur by chance due to a combination of genetics and the environment. It is recommended Irving inform her PCP about her family history so they can discuss appropriate screening and testing options, including a possible referral to cancer genetic counseling. She declined a referral to cancer genetics at this time.  Limited/no paternal family/personal history.  Maternal ethnicity reported as Black and paternal ethnicity reported as Black. Denies Ashkenazi Jewish ancestry.  Family history otherwise not remarkable for consanguinity, individuals with birth defects, intellectual disability, autism spectrum disorder, multiple spontaneous abortions, still births, or unexplained neonatal death.   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 She screened negative for the other three conditions (CF, SMA, and beta-hemoglobinopathies) which significantly reduces but does not eliminate the chance of being a carrier for those conditions. Please see report for details.  We reviewed the genetics of alpha thalassemia, autosomal recessive mode of inheritance, and clinical features of these conditions. We reviewed that Kerri Medina will either pass down two copies of the alpha globin gene (??) OR one copy of the alpha globin gene (-?) in each pregnancy. Therefore, this pregnancy is not at increased risk for hemoglobin Bart's due to four deletions of the alpha globin genes (--/--) regardless  of her reproductive partner's carrier status. The pregnancy will be at increased risk (25%) for hemoglobin H  disease if her partner is an alpha thalassemia carrier in the cis configuration (--/??). We reviewed that this is more common in [place] Asian populations and less common in those with Black ancestry.   Given these results, we discussed and offered carrier screening for Paulett's reproductive partner. We reviewed the benefits and limitations of carrier screening and that it can detect most but not all carriers.  She declined at this time, as he is not available for testing. We discussed other options, including prenatal diagnosis through amniocentesis, UNITY single gene NIPS, and postnatal testing. The benefits, risks, and limitations of each were reviewed. We reviewed that the [place]  Newborn Screening (NBS) program will screen all newborn babies for cystic fibrosis, spinal muscular atrophy, hemoglobinopathies, and numerous other conditions but will not screen for alpha thalassemia. Kerri Medina declined UNITY single gene NIPS and amniocentesis but is considering her options. She may elect to wait for the NBS results and for a referral to pediatric genetics if needed.   Newborn Screening. The [place]  Newborn Screening (NBS) program will screen all newborn babies for cystic fibrosis, spinal muscular atrophy, hemoglobinopathies, and numerous other conditions.  Advanced Maternal Age:  We briefly discussed that the chance that a fetus would be affected with a chromosome difference increases with advanced maternal age. The chance that Kerri Medina's current pregnancy would be affected with a chromosome difference based on her age alone is approximately 1 in 52 (~2.6%). We discussed that this risk may also be lower given her low-risk NIPS results.  We discussed and offered the option of amniocentesis. The technical aspects, benefits, risks, and limitations were reviewed with the patient including the 1 in 500 risk for miscarriage. We also discussed that testing can be performed postnatally if there is  concern for a chromosomal condition in the newborn. Kerri Medina declined amniocentesis.  Advanced Paternal Age:  FOB is reported to be 42 yo. We reviewed the issues surrounding advanced paternal age, such as the association with an increased chance for several fetal health conditions. There is known to be an increase risk in single gene conditions in the children of men who are over age 70-45, though the specific age varies in different studies. This chance is estimated to be no greater than 2%. These mutations may result in birth defects or genetic syndromes which may show differences on ultrasound in the second trimester. Therefore, we recommend a detailed anatomy ultrasound at approximately 18-[redacted] weeks gestation. A normal ultrasound cannot rule out these disorders. Advanced paternal age has also been associated with other conditions including autism spectrum disorders, schizophrenia and childhood acute lymphoblastic leukemia. It is important to be aware of these associations, though no recommended clinical testing is available for these conditions at this time. We discussed the option of [facility] single gene NIPS. We reviewed that this screens for 25 autosomal dominant and X-linked conditions. We reviewed that it is not diagnostic, and confirmatory testing either pre- or postnatally would be recommended in the event of a positive screening result. Moreover, negative screening does not rule out these conditions. We also discussed the possibility of this screen detecting a genetic condition in the patient. Patient declined this for now but is considering the option.  Previous Testing Completed:  Low risk NIPS: Brieonna previously completed noninvasive prenatal screening (NIPS) in this pregnancy. The result is low risk, consistent with a female fetus. This screening significantly reduces but does  not eliminate the chance that the current pregnancy has Down syndrome (trisomy 76), trisomy 48, trisomy 53, common sex  chromosome conditions, and 22q11.2 microdeletion syndrome. Please see report for details. There are many genetic conditions that cannot be detected by NIPS.   Negative ms-AFP screening: Amal previously completed a maternal serum AFP screen in this pregnancy. The result is screen negative. Please see report for details. A negative result reduces the risk that the current pregnancy has an open neural tube defect. Closed neural tube defects and some open defects may not be detected by this screen.   Plan of Care:   Declined FOB carrier screening for alpha thalassemia. Patient declined fragile X carrier screening, single gene UNITY NIPS, amniocentesis, and [facility]ara NIPS. She will reach out if she wishes to proceed with any of these tests. Postnatal referral of newborn to pediatric genetics/hematology as clinically indicated for alpha thalassemia. Follow-up ultrasound at [facility] on 05/11/2024 scheduled.   Informed consent was obtained. All questions were answered.   60 minutes were spent on the date of the encounter in service to the patient including preparation, face-to-face consultation, discussion of test reports and available next steps, pedigree construction, genetic risk assessment, documentation, and care coordination.    Thank you for sharing in the care of Kerri Medina with us .  Please do not hesitate to contact us  at [phone] if you have any questions.   Lauraine Bodily, MS, [facility] Certified Genetic Counselor   Genetic counseling student involved in appointment: No.  I provided general supervision for this patient and was immediately available for any patient care concerns. [employer]

## 2024-04-06 NOTE — Progress Notes (Signed)
 Patient information  Patient Name: Kerri Medina  Patient MRN:   [id number]  Referring practice: MFM Referring Provider: [facility]  Problem List   Patient Active Problem List   Diagnosis Date Noted   Alpha thalassemia silent carrier 03/10/2024   AMA (advanced maternal age) multigravida 35+ 02/29/2024   Maternal obesity affecting pregnancy, antepartum 02/29/2024   History of IUFD 02/29/2024   Supervision of high risk pregnancy in first trimester 01/29/2024   Obesity 08/19/2010   Maternal Fetal Medicine Consult Kerri Medina is a 43 y.o. [id number] at [redacted]w[redacted]d here for ultrasound and consultation. She had Low risk aneuploidy screening of a female fetus. Carrier screening was positive for alpha thal silent carrier. Maternal serum AFP was negative. She has no acute concerns.   Today we focused on the following:   Elevated BMI: I discussed the potential complications associated with obesity in pregnancy.  These complications include but are not limited to increased risk of excessive maternal weight gain, fetal growth abnormalities, fetal congenital disorders, inability to visualize fetal anatomic structures on ultrasound, gestational diabetes, hypertensive disorders of pregnancy, operative birth including cesarean delivery or assisted vaginal delivery, delayed wound healing and many long-term health complications.  I discussed the need for continued growth ultrasounds and possibly antenatal testing depending upon how the pregnancy course progresses.  Maternal weight gain should be limited to 10 to 20 pounds during the pregnancy.  While normal weight loss may occur during the first and early second trimester, efforts to actively lose weight with the use of medication is not recommended during pregnancy.  A whole food diet and regular exercise of at least 15 to 30 minutes of moderately strenuous activity is recommended in the absence of any contraindications. Weight loss with the use of  medications is not recommended during pregnancy.    Elevated BP: The patient has had a few elevated blood pressures during the pregnancy but nothing sustained in the hypertensive range.  She will continue to have her blood pressure assessed at future OB visits.  Today her blood pressure is 121/68.  She is compliant with aspirin  81 mg daily for preeclampsia risk reduction.  History of IUFD: Patient had a fetal demise in 2002, 39 weeks 1 day for unknown reasons.  I reviewed the placental pathology which showed a normal placenta and no evidence of infection.  The patient was never given a cause for the term demise and does not recall any specific workup.  I discussed that antiphospholipid antibodies should be ordered by the referring provider.  Ultrasound findings Single intrauterine pregnancy at 21w 5d  Fetal cardiac activity:  Observed and appears normal. Presentation: Transverse, head to maternal left. The anatomic structures that were well seen appear normal without evidence of soft markers. Due to poor acoustic windows some structures remain suboptimally visualized. Fetal biometry shows the estimated fetal weight at the 41 percentile.  Amniotic fluid: Within normal limits.  MVP: 6.24 cm. Placenta: Posterior. Adnexa: Within normal limits. Cervical length: 3.5 cm.  There are limitations of prenatal ultrasound such as the inability to detect certain abnormalities due to poor visualization. Various factors such as fetal position, gestational age and maternal body habitus may increase the difficulty in visualizing the fetal anatomy.    Recommendations - Anatomy ultrasound was done today with the above findings (see report). - Aspirin  81-162 mg from 12 weeks and continued throughout the pregnancy for preeclampsia prophylaxis. - Baseline labs: CMP, CBC, urine protein creatinine ratio if not previously completed.  Due to her history of demise antiphospholipid antibodies should be ordered by the  referring provider. - Blood pressure goal of < 140 systolic and < 90 diastolic. Antihypertensive medication should be added/adjusted until BP goal is achieved.  - Early Glucola screening (or A1c of patient declines). - Fetal echocardiogram -this should be considered if fetal cardiac anatomy is not completely assessed at the next ultrasound. - Serial growth ultrasounds every 4 weeks starting at 24 to 28 weeks until delivery. - Antenatal testing (usually weekly BPP or NST) weekly at 34 weeks until delivery. - Delivery likely around EDD or sooner if indicated.  Review of Systems: A review of systems was performed and was negative except per HPI   Past Obstetrical History:  OB History  Gravida Para Term Preterm AB Living  4 3 3   2   SAB IAB Ectopic Multiple Live Births      2    # Outcome Date GA Lbr Len/2nd Weight Sex Type Anes PTL Lv  4 Current           3 Term 2012 [redacted]w[redacted]d  7 lb (3.175 kg) M Vag-Spont   LIV  2 Term 2008 [redacted]w[redacted]d  10 lb (4.536 kg) F Vag-Spont   LIV     Birth Comments: episiotomy  1 Term 2002 [redacted]w[redacted]d       FD     Past Medical History:  Past Medical History:  Diagnosis Date   Medical history non-contributory      Past Surgical History:    Past Surgical History:  Procedure Laterality Date   NO PAST SURGERIES       Home Medications:   Current Outpatient Medications on File Prior to Visit  Medication Sig Dispense Refill   aspirin  EC 81 MG tablet Take 1 tablet (81 mg total) by mouth daily. Swallow whole. 30 tablet 12   Prenatal Vit-Fe Fumarate-FA (PRENATAL VITAMIN) 27-0.8 MG TABS Take 1 tablet by mouth daily. 90 tablet 3   No current facility-administered medications on file prior to visit.      Allergies:   No Known Allergies   Physical Exam:   Vitals:   04/06/24 0947  BP: 121/68  Pulse: 75   Sitting comfortably on the sonogram table Nonlabored breathing Normal rate and rhythm Abdomen is nontender  Thank you for the opportunity to be involved with this  patient's care. Please let us  know if we can be of any further assistance.   30 minutes of time was spent reviewing the patient's chart including labs, imaging and documentation.  At least 50% of this time was spent with direct patient care discussing the diagnosis, management and prognosis of her care.  [name] MFM, [place]   04/06/2024  11:39 AM  .

## 2024-05-10 ENCOUNTER — Telehealth: Payer: Self-pay

## 2024-05-11 ENCOUNTER — Ambulatory Visit: Payer: Self-pay

## 2024-05-16 ENCOUNTER — Encounter: Payer: Self-pay | Admitting: Obstetrics and Gynecology

## 2024-05-26 ENCOUNTER — Ambulatory Visit: Payer: Self-pay

## 2024-05-26 ENCOUNTER — Ambulatory Visit: Payer: Self-pay | Attending: Family Medicine

## 2024-05-31 ENCOUNTER — Telehealth: Payer: Self-pay

## 2024-05-31 NOTE — Telephone Encounter (Signed)
 Patient calls nurse line requesting to speak with PCP.   She reports she is currently pregnant ~29 weeks and reports she accidentally took some of her sons Risperidone.   She reports she mixes his medication in his juice every morning. She reports she thought the cup was hers and took a sip.   She reports just a very small sip.   Advised to contact her OB for further instruction.   She agreed with plan.

## 2024-06-04 ENCOUNTER — Encounter (HOSPITAL_COMMUNITY): Payer: Self-pay | Admitting: Obstetrics and Gynecology

## 2024-06-04 ENCOUNTER — Inpatient Hospital Stay (HOSPITAL_COMMUNITY)
Admission: AD | Admit: 2024-06-04 | Discharge: 2024-06-04 | Disposition: A | Discharge status: Home / Self Care | Payer: Self-pay | Attending: Obstetrics and Gynecology | Admitting: Obstetrics and Gynecology

## 2024-06-04 ENCOUNTER — Other Ambulatory Visit: Payer: Self-pay

## 2024-06-04 ENCOUNTER — Inpatient Hospital Stay (HOSPITAL_BASED_OUTPATIENT_CLINIC_OR_DEPARTMENT_OTHER): Payer: Self-pay

## 2024-06-04 DIAGNOSIS — R102 Pelvic and perineal pain: Secondary | ICD-10-CM | POA: Diagnosis present

## 2024-06-04 DIAGNOSIS — O09523 Supervision of elderly multigravida, third trimester: Secondary | ICD-10-CM | POA: Diagnosis not present

## 2024-06-04 DIAGNOSIS — O200 Threatened abortion: Secondary | ICD-10-CM | POA: Diagnosis not present

## 2024-06-04 DIAGNOSIS — O479 False labor, unspecified: Secondary | ICD-10-CM | POA: Diagnosis not present

## 2024-06-04 DIAGNOSIS — D563 Thalassemia minor: Secondary | ICD-10-CM

## 2024-06-04 DIAGNOSIS — O98313 Other infections with a predominantly sexual mode of transmission complicating pregnancy, third trimester: Secondary | ICD-10-CM | POA: Insufficient documentation

## 2024-06-04 DIAGNOSIS — A599 Trichomoniasis, unspecified: Secondary | ICD-10-CM

## 2024-06-04 DIAGNOSIS — Z3A30 30 weeks gestation of pregnancy: Secondary | ICD-10-CM

## 2024-06-04 DIAGNOSIS — O99213 Obesity complicating pregnancy, third trimester: Secondary | ICD-10-CM | POA: Diagnosis not present

## 2024-06-04 DIAGNOSIS — O99013 Anemia complicating pregnancy, third trimester: Secondary | ICD-10-CM | POA: Diagnosis not present

## 2024-06-04 DIAGNOSIS — Z8759 Personal history of other complications of pregnancy, childbirth and the puerperium: Secondary | ICD-10-CM

## 2024-06-04 DIAGNOSIS — E669 Obesity, unspecified: Secondary | ICD-10-CM

## 2024-06-04 DIAGNOSIS — O133 Gestational [pregnancy-induced] hypertension without significant proteinuria, third trimester: Secondary | ICD-10-CM | POA: Diagnosis not present

## 2024-06-04 DIAGNOSIS — O4693 Antepartum hemorrhage, unspecified, third trimester: Secondary | ICD-10-CM | POA: Diagnosis not present

## 2024-06-04 DIAGNOSIS — A5901 Trichomonal vulvovaginitis: Secondary | ICD-10-CM | POA: Insufficient documentation

## 2024-06-04 DIAGNOSIS — O09522 Supervision of elderly multigravida, second trimester: Secondary | ICD-10-CM

## 2024-06-04 DIAGNOSIS — O09293 Supervision of pregnancy with other poor reproductive or obstetric history, third trimester: Secondary | ICD-10-CM | POA: Diagnosis not present

## 2024-06-04 DIAGNOSIS — O26853 Spotting complicating pregnancy, third trimester: Secondary | ICD-10-CM | POA: Diagnosis present

## 2024-06-04 DIAGNOSIS — O4692 Antepartum hemorrhage, unspecified, second trimester: Secondary | ICD-10-CM

## 2024-06-04 DIAGNOSIS — Z148 Genetic carrier of other disease: Secondary | ICD-10-CM | POA: Diagnosis not present

## 2024-06-04 DIAGNOSIS — R58 Hemorrhage, not elsewhere classified: Secondary | ICD-10-CM | POA: Diagnosis not present

## 2024-06-04 DIAGNOSIS — O99210 Obesity complicating pregnancy, unspecified trimester: Secondary | ICD-10-CM

## 2024-06-04 LAB — URINALYSIS, ROUTINE W REFLEX MICROSCOPIC
Bilirubin Urine: NEGATIVE
Glucose, UA: NEGATIVE mg/dL
Ketones, ur: NEGATIVE mg/dL
Nitrite: NEGATIVE
Protein, ur: 100 mg/dL — AB
Specific Gravity, Urine: 1.027 (ref 1.005–1.030)
pH: 5 (ref 5.0–8.0)

## 2024-06-04 LAB — WET PREP, GENITAL
Clue Cells Wet Prep HPF POC: NONE SEEN
Sperm: NONE SEEN
WBC, Wet Prep HPF POC: >=10 — AB (ref <10)
Yeast Wet Prep HPF POC: NONE SEEN

## 2024-06-04 MED ORDER — METRONIDAZOLE 500 MG PO TABS: 500.0000 mg | ORAL_TABLET | Freq: Two times a day (BID) | ORAL | 0 refills | Status: AC

## 2024-06-04 NOTE — MAU Note (Addendum)
 Kerri Medina is a 43 y.o. at [redacted]w[redacted]d here in MAU reporting: vaginal spotting that started about 2 hours ago, denies LOF, reports having occasional contractions, and does report feeling fetal movement. Patient is also feeling intermittent vaginal pressure, no recent intercourse. Patient arrived to MAU via EMS.  EDD:  08/12/2024 Onset of complaint: 1430 today  Pain score: 3 Vitals:   06/04/24 1623 06/04/24 1628  BP:  119/66  Pulse:  80  Resp:  18  Temp: 97.9 F (36.6 C)      FHT: 150 Lab orders placed from triage: urinalysis

## 2024-06-04 NOTE — MAU Provider Note (Signed)
 History     [id number]  Arrival date and time: 06/04/24 1600    Chief Complaint  Patient presents with   Vaginal Bleeding   VAGINAL PRESSURE     HPI Kerri Medina is a 43 y.o. at 102w1d by LMP, who presents for vaginal bleeding. It started at approximately 1500. She went to use the restroom and noticed it when she wiped. There was none in the toilet bowel. Described it as light pink initially and then darker red. She then called EMS. Here in the MAU still has a little light pink discharge. This has never happened before. Denies dysuria, vaginal bleeding, LOF, contractions. Denies vaginal irritation. Endorses good FM. Denies any recent sexual intercourse or trauma to the vagina.     O/Positive/-- (04/30 1002)  Past Medical History:  Diagnosis Date   Medical history non-contributory     Past Surgical History:  Procedure Laterality Date   NO PAST SURGERIES      Family History  Problem Relation Age of Onset   Heart disease Father    Lupus Mother    Fibromyalgia Mother    Stomach cancer Mother     Social History   Socioeconomic History   Marital status: Single    Spouse name: Not on file   Number of children: Not on file   Years of education: Not on file   Highest education level: Not on file  Occupational History   Not on file  Tobacco Use   Smoking status: Former   Smokeless tobacco: Never   Tobacco comments:    for a few months age 35  Vaping Use   Vaping status: Never Used  Substance and Sexual Activity   Alcohol use: No   Drug use: No   Sexual activity: Yes    Birth control/protection: None  Other Topics Concern   Not on file  Social History Narrative   Not on file   Social Drivers of Health   Financial Resource Strain: Not on file  Food Insecurity: Not on file  Transportation Needs: Not on file  Physical Activity: Not on file  Stress: Not on file  Social Connections: Unknown (02/18/2022)   Received from [facility]   Social Network     Social Network: Not on file  Intimate Partner Violence: Unknown (02/18/2022)   Received from [facility]   HITS    Physically Hurt: Not on file    Insult or Talk Down To: Not on file    Threaten Physical Harm: Not on file    Scream or Curse: Not on file    No Known Allergies  No current facility-administered medications on file prior to encounter.   Current Outpatient Medications on File Prior to Encounter  Medication Sig Dispense Refill   aspirin  EC 81 MG tablet Take 1 tablet (81 mg total) by mouth daily. Swallow whole. 30 tablet 12   Prenatal Vit-Fe Fumarate-FA (PRENATAL VITAMIN) 27-0.8 MG TABS Take 1 tablet by mouth daily. 90 tablet 3    Pertinent positives and negative per HPI, all others reviewed and negative  Physical Exam   BP 111/67 (BP Location: Right Wrist)   Pulse 85   Temp 97.9 F (36.6 C) (Oral)   Resp 18   LMP 10/24/2023 (Approximate)   SpO2 100%   Patient Vitals for the past 24 hrs:  BP Temp Temp src Pulse Resp SpO2  06/04/24 1720 -- -- -- -- -- 100 %  06/04/24 1700 -- -- -- -- -- 99 %  06/04/24 1657 111/67 -- -- 85 18 --  06/04/24 1653 -- -- -- -- -- 99 %  06/04/24 1645 -- -- -- -- -- 100 %  06/04/24 1628 119/66 -- -- 80 18 --  06/04/24 1623 -- 97.9 F (36.6 C) Oral -- -- --    Physical Exam Vitals and nursing note reviewed. Exam conducted with a chaperone present.  Constitutional:      Appearance: She is well-developed.  HENT:     Head: Normocephalic and atraumatic.     Mouth/Throat:     Mouth: Mucous membranes are moist.  Eyes:     Extraocular Movements: Extraocular movements intact.  Cardiovascular:     Rate and Rhythm: Normal rate and regular rhythm.  Pulmonary:     Effort: Pulmonary effort is normal.  Abdominal:     Palpations: Abdomen is soft.     Tenderness: There is no abdominal tenderness.  Genitourinary:    General: Normal vulva.     Exam position: Lithotomy position.     Vagina: Vaginal discharge present.     Comments:  External cervical os closed, no cervical discharge, minimal dark brown vaginal discharge Skin:    Capillary Refill: Capillary refill takes less than 2 seconds.  Neurological:     General: No focal deficit present.     Mental Status: She is alert.      FHT Baseline: 140 bpm Variability: Good {> 6 bpm) Accelerations: Non-reactive but appropriate for gestational age Decelerations: Absent Uterine activity: None  Labs Results for orders placed or performed during the hospital encounter of 06/04/24 (from the past 24 hours)  Urinalysis, Routine w reflex microscopic -Urine, Clean Catch     Status: Abnormal   Collection Time: 06/04/24  4:31 PM  Result Value Ref Range   Color, Urine AMBER (A) YELLOW   APPearance CLOUDY (A) CLEAR   Specific Gravity, Urine 1.027 1.005 - 1.030   pH 5.0 5.0 - 8.0   Glucose, UA NEGATIVE NEGATIVE mg/dL   Hgb urine dipstick LARGE (A) NEGATIVE   Bilirubin Urine NEGATIVE NEGATIVE   Ketones, ur NEGATIVE NEGATIVE mg/dL   Protein, ur 899 (A) NEGATIVE mg/dL   Nitrite NEGATIVE NEGATIVE   Leukocytes,Ua MODERATE (A) NEGATIVE   RBC / HPF 21-50 0 - 5 RBC/hpf   WBC, UA 21-50 0 - 5 WBC/hpf   Bacteria, UA RARE (A) NONE SEEN   Squamous Epithelial / HPF 11-20 0 - 5 /HPF   Mucus PRESENT   Wet prep, genital     Status: Abnormal   Collection Time: 06/04/24  4:54 PM   Specimen: Vaginal  Result Value Ref Range   Yeast Wet Prep HPF POC NONE SEEN NONE SEEN   Trich, Wet Prep PRESENT (A) NONE SEEN   Clue Cells Wet Prep HPF POC NONE SEEN NONE SEEN   WBC, Wet Prep HPF POC >=10 (A) <10   Sperm NONE SEEN     Imaging No results found.  MAU Course  Procedures Lab Orders         Wet prep, genital         Urinalysis, Routine w reflex microscopic -Urine, Clean Catch    Meds ordered this encounter  Medications   metroNIDAZOLE  (FLAGYL ) 500 MG tablet    Sig: Take 1 tablet (500 mg total) by mouth 2 (two) times daily for 7 days.    Dispense:  14 tablet    Refill:  0   Imaging  Orders  US  MFM OB LIMITED     MDM Moderate (Level 3-4)  Assessment and Plan  #Vaginal bleeding in pregnancy, second trimester #[redacted] weeks gestation of pregnancy #Trichomonas   #FWB NST: Reactive  Kerri Medina is a 43 y.o. at [redacted]w[redacted]d by LMP, who presents for vaginal bleeding.  -Started this afternoon. No recent vaginal trauma -Denies contractions, LOF, dysuria, vaginal irritation -Wet prep positive for trichomonas -U/A looks more like contaminant with narrow RBC/WBC ratio.  -US  shows no evidence of previa or abruption.   -Stable to discharge home -Rx sent for metronidazole  500mg  BID for 7 days. Paper Rx sent for EPT -Preterm labor, 2nd trimester bleeding precautions provided -Follow up with regularly scheduled OB     Evert Wenrich L Areyana Leoni, MD/MHA 06/04/24 6:20 PM  Allergies as of 06/04/2024   No Known Allergies      Medication List     TAKE these medications    aspirin  EC 81 MG tablet Take 1 tablet (81 mg total) by mouth daily. Swallow whole.   metroNIDAZOLE  500 MG tablet Commonly known as: FLAGYL  Take 1 tablet (500 mg total) by mouth 2 (two) times daily for 7 days.   Prenatal Vitamin 27-0.8 MG Tabs Take 1 tablet by mouth daily.

## 2024-06-08 ENCOUNTER — Ambulatory Visit: Payer: Self-pay

## 2024-06-08 ENCOUNTER — Encounter: Payer: Self-pay | Admitting: Obstetrics

## 2024-06-20 ENCOUNTER — Other Ambulatory Visit: Payer: Self-pay | Admitting: *Deleted

## 2024-06-20 ENCOUNTER — Ambulatory Visit: Payer: Self-pay | Attending: Maternal & Fetal Medicine

## 2024-06-20 ENCOUNTER — Ambulatory Visit (HOSPITAL_BASED_OUTPATIENT_CLINIC_OR_DEPARTMENT_OTHER): Payer: Self-pay | Admitting: Obstetrics

## 2024-06-20 VITALS — BP 126/84 | HR 72

## 2024-06-20 DIAGNOSIS — O358XX0 Maternal care for other (suspected) fetal abnormality and damage, not applicable or unspecified: Secondary | ICD-10-CM | POA: Insufficient documentation

## 2024-06-20 DIAGNOSIS — E669 Obesity, unspecified: Secondary | ICD-10-CM | POA: Diagnosis not present

## 2024-06-20 DIAGNOSIS — Z8759 Personal history of other complications of pregnancy, childbirth and the puerperium: Secondary | ICD-10-CM

## 2024-06-20 DIAGNOSIS — Z3A32 32 weeks gestation of pregnancy: Secondary | ICD-10-CM | POA: Diagnosis not present

## 2024-06-20 DIAGNOSIS — Z3A33 33 weeks gestation of pregnancy: Secondary | ICD-10-CM | POA: Insufficient documentation

## 2024-06-20 DIAGNOSIS — O09523 Supervision of elderly multigravida, third trimester: Secondary | ICD-10-CM

## 2024-06-20 DIAGNOSIS — O99213 Obesity complicating pregnancy, third trimester: Secondary | ICD-10-CM | POA: Diagnosis not present

## 2024-06-20 DIAGNOSIS — O99210 Obesity complicating pregnancy, unspecified trimester: Secondary | ICD-10-CM | POA: Insufficient documentation

## 2024-06-20 DIAGNOSIS — O09522 Supervision of elderly multigravida, second trimester: Secondary | ICD-10-CM

## 2024-06-20 DIAGNOSIS — O09293 Supervision of pregnancy with other poor reproductive or obstetric history, third trimester: Secondary | ICD-10-CM

## 2024-06-20 DIAGNOSIS — O133 Gestational [pregnancy-induced] hypertension without significant proteinuria, third trimester: Secondary | ICD-10-CM

## 2024-06-20 DIAGNOSIS — O09299 Supervision of pregnancy with other poor reproductive or obstetric history, unspecified trimester: Secondary | ICD-10-CM | POA: Insufficient documentation

## 2024-06-20 DIAGNOSIS — Z3689 Encounter for other specified antenatal screening: Secondary | ICD-10-CM | POA: Diagnosis present

## 2024-06-20 NOTE — Progress Notes (Signed)
 MFM Consult Note  [name]. [name] is currently at 33 weeks and 3 days.  She has been followed due to maternal obesity with a BMI of 27 and advanced maternal age (43 years old).    She also has a history of an IUFD at 39 weeks during her first pregnancy.  The cause of the IUFD remains undetermined.  She has not returned her follow-up ultrasounds and has not been seen for prenatal care in almost 3 months because she said she had a lot going on.  Sonographic findings Single intrauterine pregnancy at 32w 3d.  Fetal cardiac activity:  Observed and appears normal. Presentation: Cephalic. Fetal biometry shows the estimated fetal weight of 4 pounds 2 ounces which measures at the 25th percentile. Amniotic fluid volume: Within normal limits. MVP: 5.82 cm. Placenta: Posterior.  There are limitations of prenatal ultrasound such as the inability to detect certain abnormalities due to poor visualization. Various factors such as fetal position, gestational age and maternal body habitus may increase the difficulty in visualizing the fetal anatomy.    The views of the fetal anatomy remain limited today due to maternal body habitus.  What was visualized today appeared within normal limits.  She was advised to have her baby examined after birth to ensure that there are no anomalies present.  The patient was encouraged to make an appointment for prenatal care as soon as possible.  Due to maternal obesity and advanced maternal age, we will continue to follow her with weekly fetal testing until delivery.  Due to her history of a prior IUFD at 39 weeks, delivery for her current pregnancy should be considered at around 38 weeks.    She will return in 1 week for an NST.    The patient stated that all of her questions were answered and she understood the importance of restarting prenatal care.  A total of 20 minutes was spent counseling and coordinating the care for this patient.  Greater than 50% of the time  was spent in direct face-to-face contact.

## 2024-06-22 ENCOUNTER — Encounter: Payer: Self-pay | Admitting: Obstetrics and Gynecology

## 2024-06-28 ENCOUNTER — Ambulatory Visit (INDEPENDENT_AMBULATORY_CARE_PROVIDER_SITE_OTHER): Payer: Self-pay | Admitting: Obstetrics and Gynecology

## 2024-06-28 VITALS — BP 124/80 | HR 89 | Wt 391.0 lb

## 2024-06-28 DIAGNOSIS — O99210 Obesity complicating pregnancy, unspecified trimester: Secondary | ICD-10-CM

## 2024-06-28 DIAGNOSIS — Z3A33 33 weeks gestation of pregnancy: Secondary | ICD-10-CM

## 2024-06-28 DIAGNOSIS — D563 Thalassemia minor: Secondary | ICD-10-CM

## 2024-06-28 DIAGNOSIS — Z6841 Body Mass Index (BMI) 40.0 and over, adult: Secondary | ICD-10-CM

## 2024-06-28 DIAGNOSIS — Z8759 Personal history of other complications of pregnancy, childbirth and the puerperium: Secondary | ICD-10-CM

## 2024-06-28 DIAGNOSIS — O09523 Supervision of elderly multigravida, third trimester: Secondary | ICD-10-CM

## 2024-06-28 DIAGNOSIS — O0991 Supervision of high risk pregnancy, unspecified, first trimester: Secondary | ICD-10-CM

## 2024-06-28 NOTE — Progress Notes (Signed)
 Pt is in office for ROB/NST.  Pt has not been seen in several months.  Pt did have u/s last week and needs antenatal testing.  Pt had recent +Trich, states was treated.

## 2024-06-28 NOTE — Progress Notes (Signed)
   PRENATAL VISIT NOTE  Subjective:  Kerri Medina is a 43 y.o. [phone] at [redacted]w[redacted]d being seen today for ongoing prenatal care.  She is currently monitored for the following issues for this high-risk pregnancy and has Obesity; Supervision of high risk pregnancy in first trimester; AMA (advanced maternal age) multigravida 35+; Maternal obesity affecting pregnancy, antepartum; History of IUFD; Alpha thalassemia silent carrier; and BMI 60.0-69.9, adult (HCC) on their problem list.  Patient doing well with no acute concerns today. She reports no complaints.  Contractions: Not present. Vag. Bleeding: None.  Movement: Present. Denies leaking of fluid.   The following portions of the patient's history were reviewed and updated as appropriate: allergies, current medications, past family history, past medical history, past social history, past surgical history and problem list. Problem list updated.  Objective:   Vitals:   06/28/24 1120  BP: 124/80  Pulse: 89  Weight: (!) 391 lb (177.4 kg)    Fetal Status:     Movement: Present     General:  Alert, oriented and cooperative. Patient is in no acute distress.  Skin: Skin is warm and dry. No rash noted.   Cardiovascular: Normal heart rate noted  Respiratory: Normal respiratory effort, no problems with respiration noted  Abdomen: Soft, gravid, appropriate for gestational age.  Pain/Pressure: Present     Pelvic: Cervical exam deferred        Extremities: Normal range of motion.     Mental Status:  Normal mood and affect. Normal behavior. Normal judgment and thought content.  NST today, baseline 150s, moderate variability, accels noted, no decels, cat 1 Assessment and Plan:  Pregnancy: [id number] at [redacted]w[redacted]d  1. Supervision of high risk pregnancy in first trimester (Primary) Continue routine prenatal care  - Fetal nonstress test - CBC; Future - Glucose Tolerance, 2 Hours w/1 Hour; Future - RPR; Future - HIV Antibody (routine testing w rflx);  Future  2. [redacted] weeks gestation of pregnancy  - Fetal nonstress test  3. Obesity affecting pregnancy, antepartum, unspecified obesity type   4. History of IUFD Reviewed last MFM note, advise weekly testing and probable delivery at 38 weeks - Fetal nonstress test  5. Alpha thalassemia silent carrier   6. Multigravida of advanced maternal age in third trimester   7. BMI 60.0-69.9, adult (HCC)   Preterm labor symptoms and general obstetric precautions including but not limited to vaginal bleeding, contractions, leaking of fluid and fetal movement were reviewed in detail with the patient.  Please refer to After Visit Summary for other counseling recommendations.   Return in about 2 weeks (around 07/12/2024) for [facility], in person.   Jerilynn Buddle, MD Faculty Attending Center for [facility]

## 2024-07-04 ENCOUNTER — Other Ambulatory Visit: Payer: Self-pay

## 2024-07-05 ENCOUNTER — Ambulatory Visit: Payer: Self-pay | Attending: Family Medicine

## 2024-07-05 ENCOUNTER — Ambulatory Visit: Payer: Self-pay

## 2024-07-08 ENCOUNTER — Other Ambulatory Visit: Payer: Self-pay

## 2024-07-11 ENCOUNTER — Encounter: Payer: Self-pay | Admitting: Obstetrics and Gynecology

## 2024-07-11 DIAGNOSIS — Z3A35 35 weeks gestation of pregnancy: Secondary | ICD-10-CM

## 2024-07-11 DIAGNOSIS — O0991 Supervision of high risk pregnancy, unspecified, first trimester: Secondary | ICD-10-CM

## 2024-07-15 ENCOUNTER — Encounter: Admitting: Obstetrics & Gynecology

## 2024-07-18 ENCOUNTER — Other Ambulatory Visit (HOSPITAL_COMMUNITY)
Admission: RE | Admit: 2024-07-18 | Discharge: 2024-07-18 | Disposition: A | Discharge status: Home / Self Care | Source: Ambulatory Visit | Attending: Obstetrics | Admitting: Obstetrics

## 2024-07-18 ENCOUNTER — Encounter: Payer: Self-pay | Admitting: Obstetrics

## 2024-07-18 ENCOUNTER — Other Ambulatory Visit: Payer: Self-pay

## 2024-07-18 ENCOUNTER — Encounter (HOSPITAL_COMMUNITY): Payer: Self-pay | Admitting: Obstetrics and Gynecology

## 2024-07-18 ENCOUNTER — Inpatient Hospital Stay (HOSPITAL_COMMUNITY)

## 2024-07-18 ENCOUNTER — Ambulatory Visit: Admitting: Obstetrics

## 2024-07-18 ENCOUNTER — Inpatient Hospital Stay (HOSPITAL_COMMUNITY)
Admission: AD | Admit: 2024-07-18 | Discharge: 2024-07-18 | Disposition: A | Discharge status: Home / Self Care | Attending: Family Medicine | Admitting: Family Medicine

## 2024-07-18 VITALS — BP 129/77 | HR 80 | Wt 388.5 lb

## 2024-07-18 DIAGNOSIS — Z8759 Personal history of other complications of pregnancy, childbirth and the puerperium: Secondary | ICD-10-CM

## 2024-07-18 DIAGNOSIS — O0991 Supervision of high risk pregnancy, unspecified, first trimester: Secondary | ICD-10-CM | POA: Diagnosis not present

## 2024-07-18 DIAGNOSIS — O99210 Obesity complicating pregnancy, unspecified trimester: Secondary | ICD-10-CM

## 2024-07-18 DIAGNOSIS — Z148 Genetic carrier of other disease: Secondary | ICD-10-CM | POA: Insufficient documentation

## 2024-07-18 DIAGNOSIS — O0990 Supervision of high risk pregnancy, unspecified, unspecified trimester: Secondary | ICD-10-CM | POA: Diagnosis not present

## 2024-07-18 DIAGNOSIS — O0933 Supervision of pregnancy with insufficient antenatal care, third trimester: Secondary | ICD-10-CM | POA: Diagnosis not present

## 2024-07-18 DIAGNOSIS — Z3A36 36 weeks gestation of pregnancy: Secondary | ICD-10-CM

## 2024-07-18 DIAGNOSIS — O368330 Maternal care for abnormalities of the fetal heart rate or rhythm, third trimester, not applicable or unspecified: Secondary | ICD-10-CM

## 2024-07-18 DIAGNOSIS — O09293 Supervision of pregnancy with other poor reproductive or obstetric history, third trimester: Secondary | ICD-10-CM | POA: Diagnosis not present

## 2024-07-18 DIAGNOSIS — Z369 Encounter for antenatal screening, unspecified: Secondary | ICD-10-CM | POA: Insufficient documentation

## 2024-07-18 DIAGNOSIS — E669 Obesity, unspecified: Secondary | ICD-10-CM | POA: Diagnosis not present

## 2024-07-18 DIAGNOSIS — O09523 Supervision of elderly multigravida, third trimester: Secondary | ICD-10-CM

## 2024-07-18 DIAGNOSIS — O99213 Obesity complicating pregnancy, third trimester: Secondary | ICD-10-CM | POA: Diagnosis not present

## 2024-07-18 DIAGNOSIS — O368390 Maternal care for abnormalities of the fetal heart rate or rhythm, unspecified trimester, not applicable or unspecified: Secondary | ICD-10-CM

## 2024-07-18 DIAGNOSIS — D563 Thalassemia minor: Secondary | ICD-10-CM

## 2024-07-18 LAB — CBC
HCT: 37.9 % (ref 36.0–46.0)
Hemoglobin: 12.2 g/dL (ref 12.0–15.0)
MCH: 26.2 pg (ref 26.0–34.0)
MCHC: 32.2 g/dL (ref 30.0–36.0)
MCV: 81.5 fL (ref 80.0–100.0)
Platelets: 304 K/uL (ref 150–400)
RBC: 4.65 MIL/uL (ref 3.87–5.11)
RDW: 15.4 % (ref 11.5–15.5)
WBC: 4.7 K/uL (ref 4.0–10.5)
nRBC: 0 % (ref 0.0–0.2)

## 2024-07-18 LAB — RAPID HIV SCREEN (HIV 1/2 AB+AG)
HIV 1/2 Antibodies: NONREACTIVE
HIV-1 P24 Antigen - HIV24: NONREACTIVE

## 2024-07-18 LAB — HEMOGLOBIN A1C
Hgb A1c MFr Bld: 6.1 % — ABNORMAL HIGH (ref 4.8–5.6)
Mean Plasma Glucose: 128.37 mg/dL

## 2024-07-18 NOTE — Progress Notes (Signed)
 Pt presents for NST. Pt has no questions or concerns at this time.

## 2024-07-18 NOTE — Progress Notes (Signed)
 Subjective:  Kerri Medina is a 42 y.o. [phone] at [redacted]w[redacted]d being seen today for ongoing prenatal care.  She is currently monitored for the following issues for this high-risk pregnancy and has Obesity; Supervision of high risk pregnancy in first trimester; AMA (advanced maternal age) multigravida 35+; Maternal obesity affecting pregnancy, antepartum; History of IUFD; Alpha thalassemia silent carrier; and BMI 60.0-69.9, adult (HCC) on their problem list.  Patient reports no complaints.  Contractions: Not present. Vag. Bleeding: None.  Movement: Present. Denies leaking of fluid.   The following portions of the patient's history were reviewed and updated as appropriate: allergies, current medications, past family history, past medical history, past social history, past surgical history and problem list. Problem list updated.  Objective:   Vitals:   07/18/24 1011  BP: 129/77  Pulse: 80  Weight: (!) 388 lb 8 oz (176.2 kg)    Fetal Status:     Movement: Present     General:  Alert, oriented and cooperative. Patient is in no acute distress.  Skin: Skin is warm and dry. No rash noted.   Cardiovascular: Normal heart rate noted  Respiratory: Normal respiratory effort, no problems with respiration noted  Abdomen: Soft, gravid, appropriate for gestational age. Pain/Pressure: Present     Pelvic:  Cervical exam deferred        Extremities: Normal range of motion.  Edema: None  Mental Status: Normal mood and affect. Normal behavior. Normal judgment and thought content.   Urinalysis:      Assessment and Plan:  Pregnancy: [id number] at [redacted]w[redacted]d  1. Supervision of high risk pregnancy in first trimester (Primary) Rx: - Cervicovaginal ancillary only( [place]) - Culture, beta strep (group b only)  2. Multigravida of advanced maternal age in third trimester Rx: - Fetal nonstress test:  Non Reactive  3. History of IUFD x 2  4. Obesity affecting pregnancy, antepartum, unspecified obesity type     Preterm labor symptoms and general obstetric precautions including but not limited to vaginal bleeding, contractions, leaking of fluid and fetal movement were reviewed in detail with the patient. Please refer to After Visit Summary for other counseling recommendations.   Return in about 1 week (around 07/25/2024) for [facility].   Rudy Carlin LABOR, MD 07/18/2024

## 2024-07-18 NOTE — MAU Provider Note (Signed)
 History     [id number]  Arrival date and time: 07/18/24 1232    Chief Complaint  Patient presents with   dips in FHR in office     HPI Kerri Medina is a 43 y.o. at [redacted]w[redacted]d with PMHx notable for term IUFD in first pregnancy, scant PNC, who presents for abnormal fetal heart rate tracing.   Patient seen earlier today at [facility] for prenatal visit, had swabs collected but when placed on NST there was concern for possible arrythmia so sent to MAU Here she reports she feels well Has vigorous fetal movement, no bleeding or leaking fluid, no contractions   O/Positive/-- (04/30 1002)  OB History     Gravida  4   Para  3   Term  3   Preterm      AB      Living  2      SAB      IAB      Ectopic      Multiple      Live Births  2           Past Medical History:  Diagnosis Date   Medical history non-contributory     Past Surgical History:  Procedure Laterality Date   NO PAST SURGERIES      Family History  Problem Relation Age of Onset   Heart disease Father    Lupus Mother    Fibromyalgia Mother    Stomach cancer Mother     Social History   Socioeconomic History   Marital status: Single    Spouse name: Not on file   Number of children: Not on file   Years of education: Not on file   Highest education level: Not on file  Occupational History   Not on file  Tobacco Use   Smoking status: Former   Smokeless tobacco: Never   Tobacco comments:    for a few months age 19  Vaping Use   Vaping status: Never Used  Substance and Sexual Activity   Alcohol use: No   Drug use: No   Sexual activity: Yes    Birth control/protection: None  Other Topics Concern   Not on file  Social History Narrative   Not on file   Social Drivers of Health   Financial Resource Strain: Not on file  Food Insecurity: Food Insecurity Present (07/18/2024)   Hunger Vital Sign    Worried About Running Out of Food in the Last Year: Sometimes true    Ran Out of Food  in the Last Year: Sometimes true  Transportation Needs: Unmet Transportation Needs (07/18/2024)   PRAPARE - Administrator, Civil Service (Medical): Yes    Lack of Transportation (Non-Medical): Yes  Physical Activity: Not on file  Stress: Not on file  Social Connections: Unknown (02/18/2022)   Received from [facility]   Social Network    Social Network: Not on file  Intimate Partner Violence: Not At Risk (07/18/2024)   Humiliation, Afraid, Rape, and Kick questionnaire    Fear of Current or Ex-Partner: No    Emotionally Abused: No    Physically Abused: No    Sexually Abused: No    No Known Allergies  No current facility-administered medications on file prior to encounter.   Current Outpatient Medications on File Prior to Encounter  Medication Sig Dispense Refill   aspirin  EC 81 MG tablet Take 1 tablet (81 mg total) by mouth daily. Swallow whole. 30 tablet 12  Prenatal Vit-Fe Fumarate-FA (PRENATAL VITAMIN) 27-0.8 MG TABS Take 1 tablet by mouth daily. 90 tablet 3     ROS Pertinent positives and negative per HPI, all others reviewed and negative  Physical Exam   BP 123/78 (BP Location: Right Arm)   Pulse 78   Temp 97.9 F (36.6 C) (Oral)   Resp 16   Ht 5' 6 (1.676 m)   Wt (!) 176 kg   LMP 10/24/2023 (Approximate)   SpO2 100%   BMI 62.62 kg/m   Patient Vitals for the past 24 hrs:  BP Temp Temp src Pulse Resp SpO2 Height Weight  07/18/24 1249 123/78 97.9 F (36.6 C) Oral 78 16 100 % 5' 6 (1.676 m) (!) 176 kg    Physical Exam Vitals reviewed.  Constitutional:      General: She is not in acute distress.    Appearance: She is well-developed. She is not diaphoretic.  Eyes:     General: No scleral icterus. Pulmonary:     Effort: Pulmonary effort is normal. No respiratory distress.  Skin:    General: Skin is warm and dry.  Neurological:     Mental Status: She is alert.     Coordination: Coordination normal.      Cervical Exam    Bedside  Ultrasound Not performed.  My interpretation: n/a  FHT Baseline: 145 bpm Variability: Good {> 6 bpm) Accelerations: Reactive Decelerations: Absent Uterine activity: None Cat: I  Labs No results found for this or any previous visit (from the past 24 hours).  Imaging No results found.  MAU Course  Procedures Lab Orders         Hemoglobin A1c         CBC         RPR         Rapid HIV screen (HIV 1/2 Ab+Ag)    No orders of the defined types were placed in this encounter.  Imaging Orders         US  MFM FETAL BPP WO NON STRESS     MDM Moderate (Level 3-4)  Assessment and Plan  #Abnormal fetal heart rate tracing #[redacted] weeks gestation of pregnancy NST reassuring and reactive. BPP 8/8. Overall, reassuring.   #Scant PNC On review of chart had abnormal 1hr GTT, no f/u testing done yet, has also not had third trimester labs. CBC, HIV, RPR, and A1c ordered today. Recent growth US  was normal and has f/u scheduled for tomorrow.  #FWB FHT Cat I NST: Reactive   Dispo: discharged to home in stable condition    Donnice CHRISTELLA Carolus, MD/MPH 07/18/24 2:12 PM  Allergies as of 07/18/2024   No Known Allergies      Medication List     TAKE these medications    aspirin  EC 81 MG tablet Take 1 tablet (81 mg total) by mouth daily. Swallow whole.   Prenatal Vitamin 27-0.8 MG Tabs Take 1 tablet by mouth daily.

## 2024-07-18 NOTE — MAU Note (Addendum)
 Kerri Medina is a 43 y.o. at [redacted]w[redacted]d here in MAU reporting: via EMS stating -skips in baby HR in office and I needed to be checked out and I was unsure as to how long the bus would take so I called EMS. Immediate FHR obtain upon arrival to room-- 155 pt up to change into gown. She reports +FMs, No LOF, no VB, no blurry vision, headaches, peripheral edema, or RUQ pain.    LMP: - Onset of complaint: in office Pain score: 0/10 Vitals:   07/18/24 1249  BP: 123/78  Pulse: 78  Resp: 16  Temp: 97.9 F (36.6 C)  SpO2: 100%     FHT: 155  Lab orders placed from triage: no

## 2024-07-19 ENCOUNTER — Other Ambulatory Visit: Payer: Self-pay

## 2024-07-19 ENCOUNTER — Ambulatory Visit: Payer: Self-pay

## 2024-07-19 ENCOUNTER — Ambulatory Visit: Payer: Self-pay | Admitting: Family Medicine

## 2024-07-19 DIAGNOSIS — R7303 Prediabetes: Secondary | ICD-10-CM

## 2024-07-19 DIAGNOSIS — O0991 Supervision of high risk pregnancy, unspecified, first trimester: Secondary | ICD-10-CM

## 2024-07-19 LAB — RPR: RPR Ser Ql: NONREACTIVE

## 2024-07-20 DIAGNOSIS — R7303 Prediabetes: Secondary | ICD-10-CM | POA: Insufficient documentation

## 2024-07-20 LAB — CERVICOVAGINAL ANCILLARY ONLY
Candida Glabrata: POSITIVE — AB
Candida Vaginitis: NEGATIVE
Chlamydia: NEGATIVE
Comment: NEGATIVE
Comment: NEGATIVE
Comment: NEGATIVE
Comment: NEGATIVE
Comment: NEGATIVE
Comment: NORMAL
Neisseria Gonorrhea: NEGATIVE
Trichomonas: POSITIVE — AB

## 2024-07-21 ENCOUNTER — Ambulatory Visit: Payer: Self-pay | Admitting: Obstetrics

## 2024-07-21 DIAGNOSIS — B3731 Acute candidiasis of vulva and vagina: Secondary | ICD-10-CM

## 2024-07-21 DIAGNOSIS — B9689 Other specified bacterial agents as the cause of diseases classified elsewhere: Secondary | ICD-10-CM

## 2024-07-22 LAB — CULTURE, BETA STREP (GROUP B ONLY): Strep Gp B Culture: NEGATIVE

## 2024-07-22 MED ORDER — METRONIDAZOLE 500 MG PO TABS: 500.0000 mg | ORAL_TABLET | Freq: Two times a day (BID) | ORAL | 2 refills | Status: DC

## 2024-07-22 MED ORDER — TERCONAZOLE 0.8 % VA CREA: 1.0000 | TOPICAL_CREAM | Freq: Every day | VAGINAL | 0 refills | Status: DC

## 2024-07-26 ENCOUNTER — Ambulatory Visit: Payer: Self-pay | Attending: Obstetrics and Gynecology | Admitting: Obstetrics

## 2024-07-26 ENCOUNTER — Ambulatory Visit (HOSPITAL_BASED_OUTPATIENT_CLINIC_OR_DEPARTMENT_OTHER): Payer: Self-pay

## 2024-07-26 VITALS — BP 133/87

## 2024-07-26 DIAGNOSIS — O26893 Other specified pregnancy related conditions, third trimester: Secondary | ICD-10-CM | POA: Insufficient documentation

## 2024-07-26 DIAGNOSIS — O0933 Supervision of pregnancy with insufficient antenatal care, third trimester: Secondary | ICD-10-CM | POA: Insufficient documentation

## 2024-07-26 DIAGNOSIS — R030 Elevated blood-pressure reading, without diagnosis of hypertension: Secondary | ICD-10-CM | POA: Insufficient documentation

## 2024-07-26 DIAGNOSIS — Z148 Genetic carrier of other disease: Secondary | ICD-10-CM | POA: Diagnosis not present

## 2024-07-26 DIAGNOSIS — Z3A37 37 weeks gestation of pregnancy: Secondary | ICD-10-CM | POA: Diagnosis not present

## 2024-07-26 DIAGNOSIS — Z8759 Personal history of other complications of pregnancy, childbirth and the puerperium: Secondary | ICD-10-CM | POA: Diagnosis not present

## 2024-07-26 DIAGNOSIS — E669 Obesity, unspecified: Secondary | ICD-10-CM

## 2024-07-26 DIAGNOSIS — D563 Thalassemia minor: Secondary | ICD-10-CM | POA: Diagnosis not present

## 2024-07-26 DIAGNOSIS — O09293 Supervision of pregnancy with other poor reproductive or obstetric history, third trimester: Secondary | ICD-10-CM | POA: Insufficient documentation

## 2024-07-26 DIAGNOSIS — O99213 Obesity complicating pregnancy, third trimester: Secondary | ICD-10-CM | POA: Diagnosis not present

## 2024-07-26 DIAGNOSIS — O09523 Supervision of elderly multigravida, third trimester: Secondary | ICD-10-CM

## 2024-07-26 DIAGNOSIS — O99210 Obesity complicating pregnancy, unspecified trimester: Secondary | ICD-10-CM

## 2024-07-26 NOTE — Progress Notes (Signed)
 MFM Consult Note  Kerri Medina is currently at 37 weeks and 4 days.  She has been followed due to maternal obesity with a BMI of 47 and advanced maternal age (43 years old).    She also has a history of an IUFD at 39 weeks during her first pregnancy.  The cause of the IUFD remains undetermined.  Sonographic findings Single intrauterine pregnancy. Fetal cardiac activity: Observed. Presentation: Cephalic. Fetal biometry shows the estimated fetal weight of 6 pounds 3 ounces which measures at the 19th percentile. Amniotic fluid: Within normal limits.  AFI: 12.65 cm.  MVP: 5.65 cm. Placenta: Posterior. BPP: 8/8.   Due to her history of a prior IUFD at 39 weeks, maternal obesity, and her age, delivery for her current pregnancy should be considered at around 38 weeks.    The patient will discuss scheduling an an induction for later this week during her prenatal visit tomorrow.  As she will be delivered, no further exams were scheduled in our office.  The patient stated that all of her questions were answered and she understood the importance of restarting prenatal care.  A total of 20 minutes was spent counseling and coordinating the care for this patient.  Greater than 50% of the time was spent in direct face-to-face contact.

## 2024-07-27 ENCOUNTER — Encounter: Payer: Self-pay | Admitting: Obstetrics

## 2024-07-27 ENCOUNTER — Telehealth (HOSPITAL_COMMUNITY): Payer: Self-pay | Admitting: *Deleted

## 2024-07-27 ENCOUNTER — Ambulatory Visit (INDEPENDENT_AMBULATORY_CARE_PROVIDER_SITE_OTHER): Admitting: Obstetrics

## 2024-07-27 ENCOUNTER — Encounter: Admitting: Obstetrics

## 2024-07-27 ENCOUNTER — Encounter (HOSPITAL_COMMUNITY): Payer: Self-pay | Admitting: *Deleted

## 2024-07-27 VITALS — BP 132/84 | HR 79 | Wt 392.6 lb

## 2024-07-27 DIAGNOSIS — R7303 Prediabetes: Secondary | ICD-10-CM

## 2024-07-27 DIAGNOSIS — D563 Thalassemia minor: Secondary | ICD-10-CM | POA: Diagnosis not present

## 2024-07-27 DIAGNOSIS — O0991 Supervision of high risk pregnancy, unspecified, first trimester: Secondary | ICD-10-CM | POA: Diagnosis not present

## 2024-07-27 DIAGNOSIS — Z8759 Personal history of other complications of pregnancy, childbirth and the puerperium: Secondary | ICD-10-CM | POA: Diagnosis not present

## 2024-07-27 DIAGNOSIS — Z3A37 37 weeks gestation of pregnancy: Secondary | ICD-10-CM

## 2024-07-27 DIAGNOSIS — O0993 Supervision of high risk pregnancy, unspecified, third trimester: Secondary | ICD-10-CM

## 2024-07-27 DIAGNOSIS — O09523 Supervision of elderly multigravida, third trimester: Secondary | ICD-10-CM

## 2024-07-27 NOTE — Progress Notes (Signed)
 Subjective:  Kerri Medina is a 43 y.o. [phone] at [redacted]w[redacted]d being seen today for ongoing prenatal care.  She is currently monitored for the following issues for this high-risk pregnancy and has Obesity; Supervision of high risk pregnancy in first trimester; AMA (advanced maternal age) multigravida 35+; Maternal obesity affecting pregnancy, antepartum; History of IUFD; Alpha thalassemia silent carrier; BMI 60.0-69.9, adult (HCC); and Prediabetes on their problem list.  Patient reports no complaints.  Contractions: Irritability. Vag. Bleeding: None.  Movement: Present. Denies leaking of fluid.   The following portions of the patient's history were reviewed and updated as appropriate: allergies, current medications, past family history, past medical history, past social history, past surgical history and problem list. Problem list updated.  Objective:   Vitals:   07/27/24 0945 07/27/24 0947  BP: (!) 143/82 132/84  Pulse: 75 79  Weight: (!) 392 lb 9.6 oz (178.1 kg)     Fetal Status: Fetal Heart Rate (bpm): 142   Movement: Present     General:  Alert, oriented and cooperative. Patient is in no acute distress.  Skin: Skin is warm and dry. No rash noted.   Cardiovascular: Normal heart rate noted  Respiratory: Normal respiratory effort, no problems with respiration noted  Abdomen: Soft, gravid, appropriate for gestational age. Pain/Pressure: Present     Pelvic:  Cervical exam deferred        Extremities: Normal range of motion.  Edema: None  Mental Status: Normal mood and affect. Normal behavior. Normal judgment and thought content.   Urinalysis:      ULTRASOUND:   US  MFM OB FOLLOW UP (Accession [id number]) (Order [id number]) Imaging Date: 07/26/2024 Department: [facility] Released By: Loreli Erminio HERO, RT Authorizing: Ileana Babara Rushie Steffan, MD   Exam Status  Status  Final [99]   PACS Intelerad Image Link   Show images for US  MFM OB FOLLOW UP Related  Results          US  MFM FETAL BPP WO NON STRESS Final result 07/26/2024       ----------------------------------------------------------------------  OBSTETRICS REPORT                       (Signed Final 07/26/2024 12:09 pm) ---------------------------------------------------------------------- Patient Info   ID #:       [id number]                          D.O.B.:  10-26-1980 (43 yrs)(F)  Name:       Kerri Medina                  Visit Date: 07/26/2024 10:58 am ---------------------------------------------------------------------- Performed By ...   Study Result  Narrative & Impression    ----------------------------------------------------------------------  OBSTETRICS REPORT                       (Signed Final 07/26/2024 12:09 pm) ---------------------------------------------------------------------- Patient Info    ID #:       [id number]                          D.O.B.:  05-23-1981 (43 yrs)(F)  Name:       Kerri Medina                  Visit Date: 07/26/2024 10:58 am ---------------------------------------------------------------------- Performed By    Attending:  Steffan Keys MD         Ref. Address:     [address]                                                             [place], [place]                                                             [address]  Performed By:     Erminio Gentry            Secondary Phy.:   [facility] MAU/Triage                    RDMS  Referred By:      [facility]             Location[facility] ---------------------------------------------------------------------- Orders    #  Description                           Code        Ordered By  1  US   MFM FETAL BPP WO NON               76819.01    YU FANG     STRESS  2  US  MFM OB FOLLOW UP                   23183.98    YU FANG ----------------------------------------------------------------------    #  Order #  Accession #                Episode #  1  [id number]                   [id number]                 [id number]  2  [id number]                   [id number]                 [id number] ---------------------------------------------------------------------- Indications    Obesity complicating pregnancy, third          O99.213  trimester (BMI 71)  Advanced maternal age multigravida 47+,        O8.523  third trimester (43 yo)  Poor obstetric history: Previous IUFD (39      O09.299  weeks)  Elevated blood pressure affecting pregnancy    O13.3  in third trimester  Poor obstetric history: Prior fetal            O09.299  macrosomia, antepartum (10 lbs)  Insufficient Prenatal Care                     O09.30  Genetic carrier (Silent carrier Alpha Thal)    Z14.8  [redacted] weeks gestation of pregnancy                Z3A.37  LOW risk NIPS ---------------------------------------------------------------------- Fetal Evaluation    Num Of Fetuses:         1  Fetal Heart Rate(bpm):  152  Cardiac Activity:       Observed  Presentation:           Cephalic  Placenta:               Posterior  P. Cord Insertion:      Previously seen  Amniotic Fluid  AFI FV:      Within normal limits    AFI Sum(cm)     %Tile       Largest Pocket(cm)  12.65           45          5.65    RUQ(cm)       RLQ(cm)       LUQ(cm)        LLQ(cm)  5.65          0             5.17           1.83 ---------------------------------------------------------------------- Biophysical Evaluation    Amniotic F.V:   Within normal limits       F. Tone:        Observed  F. Movement:    Observed                   Score:          8/8  F. Breathing:    Observed ---------------------------------------------------------------------- Biometry    BPD:      91.1  mm     G. Age:  37w 0d         53  %    CI:        78.77   %    70 - 86  FL/HC:      20.6   %    20.9 - 22.7  HC:      324.6  mm     G. Age:  36w 5d         12  %    HC/AC:      1.00        0.92 - 1.05  AC:      323.6  mm     G. Age:  36w 2d         29  %    FL/BPD:     73.3   %    71 - 87  FL:       66.8  mm     G. Age:  34w 3d        1.5  %    FL/AC:      20.6   %    20 - 24  HUM:      57.4  mm     G. Age:  33w 2d        < 5  %  Est. FW:    2800  gm      6 lb 3 oz     19  % ---------------------------------------------------------------------- OB History    Gravidity:    4         Term:   3        Prem:   0        SAB:   0  TOP:          0       Ectopic:  0        Living: 2 ---------------------------------------------------------------------- Gestational Age    LMP:           39w 3d        Date:  10/24/23                   EDD:   07/30/24  U/S Today:     36w 1d                                        EDD:   08/22/24  Best:          37w 4d     Det. By:  Early Ultrasound         EDD:   08/12/24                                      (02/05/24) ---------------------------------------------------------------------- Targeted Anatomy    Central Nervous System  Calvarium/Cranial V.:  Appears normal         Cereb./Vermis:          Previously seen  Cavum:                 Not well visualized    Sales Executive:         Previously seen  Lateral Ventricles:    Previously seen        Midline Falx:           Previously seen  Choroid Plexus:        Previously seen    Spine  Cervical:              Previously  seen        Sacral:                 Previously seen  Thoracic:              Previously seen        Shape/Curvature:        Previously seen  Lumbar:                Previously seen    Head/Neck  Lips:                  Not well  visualized    Profile:                Previously seen  Neck:                  Not well visualized    Orbits/Eyes:            Not well visualized  Nuchal Fold:           Previously seen        Mandible:               Not well visualized  Nasal Bone:            Previously seen        Maxilla:                Not well visualized  Thorax  4 Chamber View:        Previously seen        Interventr. Septum:     Previously seen  Cardiac Rhythm:        Normal                 Cardiac Axis:           Normal  Cardiac Situs:         Previously seen        Diaphragm:              Previously seen  Rt Outflow Tract:      Previously seen        3 Vessel View:          Not well visualized  Lt Outflow Tract:      Previously seen        3 V Trachea View:       Not well visualized  Aortic Arch:           Previously seen        IVC:                    Not well visualized  Ductal Arch:           Not well visualized    Crossing:               Previously seen  SVC:                   Not well visualized    Abdomen  Ventral Wall:          Previously seen        Lt Kidney:              Appears normal  Cord Insertion:        Previously seen        Rt Kidney:  Appears normal  Situs:                 Previously seen        Bladder:                Appears normal  Stomach:               Appears normal    Extremities  Lt Humerus:            Previously seen        Lt Femur:               Previously seen  Rt Humerus:            Previously seen        Rt Femur:               Previously seen  Lt Forearm:            Previously seen        Lt Lower Leg:           Previously seen  Rt Forearm:            Previously seen        Rt Lower Leg:           Previously seen  Lt Hand:               Previously seen        Lt Foot:                Heel visualized prev  Rt Hand:               Not well visualized    Rt Foot:                Heel visualized prev    Other  Umbilical Cord:        Previously seen        Genitalia:               Female prev seen    Comment:     Technically difficult due to maternal habitus, fetal position, and               advanced GA. ---------------------------------------------------------------------- Comments  Rainie C. Peterkin is currently at 37 weeks and 4 days.  She  has been followed due to maternal obesity with a BMI of 16  and advanced maternal age (42 years old).  She also has a history of an IUFD at 39 weeks during her first  pregnancy.  The cause of the IUFD remains undetermined.  Sonographic findings  Single intrauterine pregnancy.  Fetal cardiac activity: Observed.  Presentation: Cephalic.  Fetal biometry shows the estimated fetal weight of 6 pounds  3 ounces which measures at the 19th percentile.  Amniotic fluid: Within normal limits.  AFI: 12.65 cm.  MVP:  5.65 cm.  Placenta: Posterior.  BPP: 8/8.    Due to her history of a prior IUFD at 39 weeks, maternal  obesity, and her age, delivery for her current pregnancy  should be considered at around 38 weeks.  The patient will discuss scheduling an an induction for later  this week during her prenatal visit tomorrow.  As she will be delivered, no further exams were scheduled in  our office.  The patient stated that all of her questions were answered  and she understood the importance of restarting prenatal  care.  A total  of 20 minutes was spent counseling and coordinating  the care for this patient.  Greater than 50% of the time was  spent in direct face-to-face contact. ----------------------------------------------------------------------                   Steffan Keys, MD Electronically Signed Final Report   07/26/2024 12:09 pm ----------------------------------------------------------------------        Assessment and Plan:  Pregnancy: [id number] at [redacted]w[redacted]d  1. Supervision of high risk pregnancy in first trimester (Primary)  2. Multigravida of advanced maternal age in third trimester  3. Prediabetes  4.  History of IUFD at 39 weeks - IOL at 38 weeks, per MFM  5. Alpha thalassemia silent carrier    Term labor symptoms and general obstetric precautions including but not limited to vaginal bleeding, contractions, leaking of fluid and fetal movement were reviewed in detail with the patient. Please refer to After Visit Summary for other counseling recommendations.   Return in about 4 weeks (around 08/24/2024) for postpartum visit.   Rudy Carlin LABOR, MD 07/27/2024

## 2024-07-27 NOTE — Progress Notes (Signed)
 lab294Pt presents for hob. Pt has no questions or concerns at this time.

## 2024-07-27 NOTE — Telephone Encounter (Signed)
 Preadmission screen

## 2024-07-28 LAB — CBC
Hematocrit: 40.3 % (ref 34.0–46.6)
Hemoglobin: 12.5 g/dL (ref 11.1–15.9)
MCH: 26 pg — ABNORMAL LOW (ref 26.6–33.0)
MCHC: 31 g/dL — ABNORMAL LOW (ref 31.5–35.7)
MCV: 84 fL (ref 79–97)
Platelets: 296 x10E3/uL (ref 150–450)
RBC: 4.8 x10E6/uL (ref 3.77–5.28)
RDW: 15.1 % (ref 11.7–15.4)
WBC: 4.6 x10E3/uL (ref 3.4–10.8)

## 2024-07-28 LAB — HIV ANTIBODY (ROUTINE TESTING W REFLEX): HIV Screen 4th Generation wRfx: NONREACTIVE

## 2024-07-28 LAB — RPR: RPR Ser Ql: NONREACTIVE

## 2024-07-29 ENCOUNTER — Encounter (HOSPITAL_COMMUNITY): Payer: Self-pay | Admitting: Family Medicine

## 2024-07-29 ENCOUNTER — Inpatient Hospital Stay (HOSPITAL_COMMUNITY)
Admission: RE | Admit: 2024-07-29 | Discharge: 2024-08-01 | DRG: 788 | Disposition: A | Discharge status: Home / Self Care | Payer: Self-pay | Attending: Obstetrics and Gynecology | Admitting: Obstetrics and Gynecology

## 2024-07-29 ENCOUNTER — Telehealth: Payer: Self-pay | Admitting: Advanced Practice Midwife

## 2024-07-29 ENCOUNTER — Other Ambulatory Visit: Payer: Self-pay

## 2024-07-29 ENCOUNTER — Inpatient Hospital Stay (HOSPITAL_COMMUNITY)

## 2024-07-29 DIAGNOSIS — O26893 Other specified pregnancy related conditions, third trimester: Secondary | ICD-10-CM | POA: Diagnosis not present

## 2024-07-29 DIAGNOSIS — O99214 Obesity complicating childbirth: Secondary | ICD-10-CM | POA: Diagnosis present

## 2024-07-29 DIAGNOSIS — Z7982 Long term (current) use of aspirin: Secondary | ICD-10-CM | POA: Diagnosis not present

## 2024-07-29 DIAGNOSIS — Z5982 Transportation insecurity: Secondary | ICD-10-CM

## 2024-07-29 DIAGNOSIS — O99210 Obesity complicating pregnancy, unspecified trimester: Secondary | ICD-10-CM | POA: Diagnosis present

## 2024-07-29 DIAGNOSIS — D563 Thalassemia minor: Secondary | ICD-10-CM | POA: Diagnosis present

## 2024-07-29 DIAGNOSIS — Z148 Genetic carrier of other disease: Secondary | ICD-10-CM | POA: Diagnosis not present

## 2024-07-29 DIAGNOSIS — Z8249 Family history of ischemic heart disease and other diseases of the circulatory system: Secondary | ICD-10-CM

## 2024-07-29 DIAGNOSIS — O34211 Maternal care for low transverse scar from previous cesarean delivery: Secondary | ICD-10-CM | POA: Diagnosis not present

## 2024-07-29 DIAGNOSIS — R030 Elevated blood-pressure reading, without diagnosis of hypertension: Secondary | ICD-10-CM | POA: Diagnosis not present

## 2024-07-29 DIAGNOSIS — I10 Essential (primary) hypertension: Secondary | ICD-10-CM | POA: Insufficient documentation

## 2024-07-29 DIAGNOSIS — R7303 Prediabetes: Secondary | ICD-10-CM | POA: Diagnosis not present

## 2024-07-29 DIAGNOSIS — Z3490 Encounter for supervision of normal pregnancy, unspecified, unspecified trimester: Principal | ICD-10-CM | POA: Diagnosis present

## 2024-07-29 DIAGNOSIS — Z3A38 38 weeks gestation of pregnancy: Secondary | ICD-10-CM | POA: Diagnosis not present

## 2024-07-29 DIAGNOSIS — Z87891 Personal history of nicotine dependence: Secondary | ICD-10-CM | POA: Diagnosis not present

## 2024-07-29 DIAGNOSIS — Z6841 Body Mass Index (BMI) 40.0 and over, adult: Secondary | ICD-10-CM

## 2024-07-29 DIAGNOSIS — Z5941 Food insecurity: Secondary | ICD-10-CM

## 2024-07-29 DIAGNOSIS — Z59819 Housing instability, housed unspecified: Secondary | ICD-10-CM | POA: Diagnosis present

## 2024-07-29 DIAGNOSIS — O09529 Supervision of elderly multigravida, unspecified trimester: Secondary | ICD-10-CM

## 2024-07-29 DIAGNOSIS — O0991 Supervision of high risk pregnancy, unspecified, first trimester: Secondary | ICD-10-CM

## 2024-07-29 MED ORDER — OXYTOCIN-SODIUM CHLORIDE 30-0.9 UT/500ML-% IV SOLN
1.0000 m[IU]/min | INTRAVENOUS | Status: DC
  Administered 2024-07-30: 4 m[IU]/min via INTRAVENOUS
  Administered 2024-07-30: 2 m[IU]/min via INTRAVENOUS
  Filled 2024-07-29 (×2): qty 500

## 2024-07-29 MED ORDER — LACTATED RINGERS IV SOLN
500.0000 mL | INTRAVENOUS | Status: DC | PRN
  Administered 2024-07-30: 1000 mL via INTRAVENOUS

## 2024-07-29 MED ORDER — MISOPROSTOL 50MCG HALF TABLET: 50.0000 ug | ORAL_TABLET | Freq: Once | ORAL | Status: DC

## 2024-07-29 MED ORDER — OXYCODONE-ACETAMINOPHEN 5-325 MG PO TABS: 1.0000 | ORAL_TABLET | ORAL | Status: DC | PRN

## 2024-07-29 MED ORDER — SOD CITRATE-CITRIC ACID 500-334 MG/5ML PO SOLN: 30.0000 mL | ORAL | Status: DC | PRN

## 2024-07-29 MED ORDER — SODIUM CHLORIDE 0.9% FLUSH: 3.0000 mL | Freq: Two times a day (BID) | INTRAVENOUS | Status: DC

## 2024-07-29 MED ORDER — SODIUM CHLORIDE 0.9 % IV SOLN: 250.0000 mL | INTRAVENOUS | Status: DC | PRN

## 2024-07-29 MED ORDER — HYDROXYZINE HCL 50 MG PO TABS: 50.0000 mg | ORAL_TABLET | Freq: Four times a day (QID) | ORAL | Status: DC | PRN

## 2024-07-29 MED ORDER — TERBUTALINE SULFATE 1 MG/ML IJ SOLN
0.2500 mg | Freq: Once | INTRAMUSCULAR | Status: DC | PRN
  Filled 2024-07-29: qty 1

## 2024-07-29 MED ORDER — ONDANSETRON HCL 4 MG/2ML IJ SOLN: 4.0000 mg | Freq: Four times a day (QID) | INTRAMUSCULAR | Status: DC | PRN

## 2024-07-29 MED ORDER — SODIUM CHLORIDE 0.9% FLUSH: 3.0000 mL | INTRAVENOUS | Status: DC | PRN

## 2024-07-29 MED ORDER — LACTATED RINGERS IV SOLN: INTRAVENOUS | Status: DC

## 2024-07-29 MED ORDER — LIDOCAINE HCL (PF) 1 % IJ SOLN: 30.0000 mL | INTRAMUSCULAR | Status: DC | PRN

## 2024-07-29 MED ORDER — OXYCODONE-ACETAMINOPHEN 5-325 MG PO TABS: 2.0000 | ORAL_TABLET | ORAL | Status: DC | PRN

## 2024-07-29 MED ORDER — ACETAMINOPHEN 325 MG PO TABS: 650.0000 mg | ORAL_TABLET | ORAL | Status: DC | PRN

## 2024-07-29 MED ORDER — OXYTOCIN BOLUS FROM INFUSION: 333.0000 mL | Freq: Once | INTRAVENOUS | Status: DC

## 2024-07-29 MED ORDER — FENTANYL CITRATE (PF) 100 MCG/2ML IJ SOLN
50.0000 ug | INTRAMUSCULAR | Status: DC | PRN
  Administered 2024-07-30: 100 ug via INTRAVENOUS
  Administered 2024-07-30: 50 ug via INTRAVENOUS
  Filled 2024-07-29 (×2): qty 2

## 2024-07-29 MED ORDER — MISOPROSTOL 25 MCG QUARTER TABLET: 25.0000 ug | ORAL_TABLET | Freq: Once | ORAL | Status: DC

## 2024-07-29 MED ORDER — OXYTOCIN-SODIUM CHLORIDE 30-0.9 UT/500ML-% IV SOLN: 2.5000 [IU]/h | INTRAVENOUS | Status: DC

## 2024-07-29 NOTE — Progress Notes (Signed)
 Telephone encounter 07/29/2024 in [facility] 2S Labor and Delivery

## 2024-07-29 NOTE — H&P (Signed)
 OBSTETRIC ADMISSION HISTORY AND PHYSICAL  Kerri Medina is 43 y.o. [phone] with IUP at [redacted]w[redacted]d 08/12/2024, by Ultrasound presenting for IOL for hx of term IUFD. She received her prenatal care at [facility]   ROS (+) FM, ctx irregular (-) VB, LOF. HA, visual changes, CP, SOB, RUQ pain, peripheral edema.   Prenatal History/Complications      NURSING  PROVIDER  Office Location [facility]/ XFER TO [facility] Dating by    Adopt A Mom? No Anatomy U/S incomplete  Initiated care at  [facility]  English               LAB RESULTS   Support Person   Genetics NIPS:    LR         AFP:  neg                Carrier Screen Horizon:  Rhogam  O/Positive/-- (04/30 1002) A1C/GTT Early:             Third trimester: 36 wk A1c 6.1%  Flu Vaccine        TDaP Vaccine   Blood Type O/Positive/-- (04/30 1002)  Covid Vaccine YES Antibody Negative (04/30 1002)  RSV Abrysvo Vax   Rubella 3.24 (04/30 1002)  Feeding Plan  BOTTLE RPR Non Reactive (04/30 1002)  Contraception  UNSURE HBsAg Negative (04/30 1002)  Circumcision  YES HIV Non Reactive (04/30 1002)  Pediatrician   UNSURE HCVAb Non Reactive (04/30 1002)  Prenatal Classes            Pap       Diagnosis  Date Value Ref Range Status  01/29/2024     Final    - Negative for intraepithelial lesion or malignancy (NILM)    BTL Consent   GC/CT Initial:             36wks:  VBAC Consent   GBS   For PCN allergy, check sensitivities   Aspirin  indicated? Yes      DME Rx [X]  BP cuff [ ]  Weight Scale Waterbirth  [ ]  Class [ ]  Consent [ ]  CNM visit  PHQ9 & GAD7 [0] new OB [  ] 28 weeks  [  ] 36 weeks Induction  [ ]  Orders Entered    OB History  Gravida Para Term Preterm AB Living  4 3 3   2   SAB IAB Ectopic Multiple Live Births      2    # Outcome Date GA Lbr Len/2nd Weight Sex Type Anes PTL Lv  4 Current           3 Term 2012 [redacted]w[redacted]d  3175 g M Vag-Spont   LIV  2 Term 2008 [redacted]w[redacted]d  4536 g F Vag-Spont   LIV     Birth Comments: episiotomy  1 Term 2002 [redacted]w[redacted]d     Vag-Spont   FD   Patient Active Problem List   Diagnosis Date Noted   Encounter for induction of labor 07/29/2024   Prediabetes 07/20/2024   BMI 60.0-69.9, adult (HCC) 06/28/2024   Alpha thalassemia silent carrier 03/10/2024   AMA (advanced maternal age) multigravida 35+ 02/29/2024   Maternal obesity affecting pregnancy, antepartum 02/29/2024   History of IUFD 02/29/2024   Supervision of high risk pregnancy in first trimester 01/29/2024   Obesity 08/19/2010    Past Medical History: Past Medical History:  Diagnosis Date   Medical history non-contributory  Yeast infection     Past Surgical History: Past Surgical History:  Procedure Laterality Date   NO PAST SURGERIES      Social History Social History   Socioeconomic History   Marital status: Single    Spouse name: Not on file   Number of children: Not on file   Years of education: Not on file   Highest education level: Not on file  Occupational History   Not on file  Tobacco Use   Smoking status: Former   Smokeless tobacco: Never   Tobacco comments:    for a few months age 78  Vaping Use   Vaping status: Never Used  Substance and Sexual Activity   Alcohol use: No   Drug use: No   Sexual activity: Yes    Birth control/protection: None  Other Topics Concern   Not on file  Social History Narrative   Not on file   Social Drivers of Health   Financial Resource Strain: Not on file  Food Insecurity: Food Insecurity Present (07/18/2024)   Hunger Vital Sign    Worried About Running Out of Food in the Last Year: Sometimes true    Ran Out of Food in the Last Year: Sometimes true  Transportation Needs: Unmet Transportation Needs (07/18/2024)   PRAPARE - Administrator, Civil Service (Medical): Yes    Lack of Transportation (Non-Medical): Yes  Physical Activity: Not on file  Stress: Not on file  Social Connections: Unknown (02/18/2022)   Received from [employer]   Social Network    Social  Network: Not on file    Family History: Family History  Problem Relation Age of Onset   Heart disease Father    Lupus Mother    Fibromyalgia Mother    Stomach cancer Mother     Allergies: No Known Allergies  Medications Prior to Admission  Medication Sig Dispense Refill Last Dose/Taking   aspirin  EC 81 MG tablet Take 1 tablet (81 mg total) by mouth daily. Swallow whole. 30 tablet 12    metroNIDAZOLE  (FLAGYL ) 500 MG tablet Take 1 tablet (500 mg total) by mouth 2 (two) times daily. 14 tablet 2    Prenatal Vit-Fe Fumarate-FA (PRENATAL VITAMIN) 27-0.8 MG TABS Take 1 tablet by mouth daily. 90 tablet 3    terconazole (TERAZOL 3) 0.8 % vaginal cream Place 1 applicator vaginally at bedtime. (Patient not taking: Reported on 07/26/2024) 20 g 0      Review of Systems  All systems reviewed and negative except as stated in HPI  PHYSICAL EXAM Height 5' 6 (1.676 m), weight (!) 179.9 kg, last menstrual period 10/24/2023. General appearance: alert, cooperative, appears stated age, and no distress Lungs: respirations nonlabored Heart: regular rate Abdomen: gravid  Fetal monitoringBaseline: 145 bpm, Variability: Good {> 6 bpm), Accelerations: Reactive, and Decelerations: variable  Uterine activity: irregular    Presentation: cephalic   Prenatal labs: ABO, Rh: O/Positive/-- (04/30 1002) Antibody: Negative (04/30 1002) Rubella: 3.24 (04/30 1002) RPR: Non Reactive (11/05 1022)  HBsAg: Negative (04/30 1002)  HIV: Non Reactive (11/05 1022)   Lab Results  Component Value Date   GBS Negative 07/18/2024    Anatomy US : incomplete, completed views normal  Immunization History  Administered Date(s) Administered   PFIZER(Purple Top)SARS-COV-2 Vaccination 08/15/2020, 09/19/2020    Prenatal Transfer Tool  Maternal Diabetes: No Genetic Screening: Normal Maternal Ultrasounds/Referrals: Normal Fetal Ultrasounds or other Referrals:  Referred to Materal Fetal Medicine  Maternal Substance  Abuse:  No Significant Maternal  Medications:  None Significant Maternal Lab Results: None Number of Prenatal Visits:greater than 3 verified prenatal visits Maternal Vaccinations:Covid Other Comments:  None   No results found for this or any previous visit (from the past 24 hours).  Patient Active Problem List   Diagnosis Date Noted   Encounter for induction of labor 07/29/2024   Prediabetes 07/20/2024   BMI 60.0-69.9, adult (HCC) 06/28/2024   Alpha thalassemia silent carrier 03/10/2024   AMA (advanced maternal age) multigravida 35+ 02/29/2024   Maternal obesity affecting pregnancy, antepartum 02/29/2024   History of IUFD 02/29/2024   Supervision of high risk pregnancy in first trimester 01/29/2024   Obesity 08/19/2010    ASSESSMENT & PLAN FRANCILE WOOLFORD is 43 y.o. [id number] with IUP at [redacted]w[redacted]d 08/12/2024, by Ultrasound admitted for IOL 2/2 history of term IUFD at 39 weeks. SABRA Deans at 37weeks: normal anatomy, cephalic presentation, posterior placenta, EFW 2800g, (19%)  #Labor: IOL in latent phase with cervical ripening indicated. RBA of cytotec d/w patient. Patient desires  #Pain: Per patient preference, encourage ambulation #FWB: Cat 2  #Social support: limited, living at [facility]  #GBS status:  negative #Feeding: Formula #Reproductive Life planning: IUD ? #Circ:  not applicable   Camie Rote, MSN, CNM, RNC-OB Certified Nurse Midwife, [facility] 07/29/2024 11:31 PM

## 2024-07-30 ENCOUNTER — Inpatient Hospital Stay (HOSPITAL_COMMUNITY): Admitting: Anesthesiology

## 2024-07-30 ENCOUNTER — Encounter (HOSPITAL_COMMUNITY): Payer: Self-pay | Admitting: Family Medicine

## 2024-07-30 ENCOUNTER — Encounter (HOSPITAL_COMMUNITY)
Admission: RE | Disposition: A | Discharge status: Home / Self Care | Payer: Self-pay | Source: Home / Self Care | Attending: Obstetrics and Gynecology

## 2024-07-30 DIAGNOSIS — Z3A38 38 weeks gestation of pregnancy: Secondary | ICD-10-CM

## 2024-07-30 DIAGNOSIS — Z3A00 Weeks of gestation of pregnancy not specified: Secondary | ICD-10-CM | POA: Diagnosis not present

## 2024-07-30 DIAGNOSIS — R030 Elevated blood-pressure reading, without diagnosis of hypertension: Secondary | ICD-10-CM | POA: Diagnosis not present

## 2024-07-30 HISTORY — PX: INTRAUTERINE DEVICE (IUD) INSERTION: SHX5877

## 2024-07-30 LAB — COMPREHENSIVE METABOLIC PANEL WITH GFR
ALT: 13 U/L (ref 0–44)
AST: 14 U/L — ABNORMAL LOW (ref 15–41)
Albumin: 2.6 g/dL — ABNORMAL LOW (ref 3.5–5.0)
Alkaline Phosphatase: 86 U/L (ref 38–126)
Anion gap: 10 (ref 5–15)
BUN: 11 mg/dL (ref 6–20)
CO2: 19 mmol/L — ABNORMAL LOW (ref 22–32)
Calcium: 8.8 mg/dL — ABNORMAL LOW (ref 8.9–10.3)
Chloride: 105 mmol/L (ref 98–111)
Creatinine, Ser: 0.59 mg/dL (ref 0.44–1.00)
GFR, Estimated: >60 mL/min (ref >60)
Glucose, Bld: 133 mg/dL — ABNORMAL HIGH (ref 70–99)
Potassium: 3.9 mmol/L (ref 3.5–5.1)
Sodium: 134 mmol/L — ABNORMAL LOW (ref 135–145)
Total Bilirubin: 0.4 mg/dL (ref 0.0–1.2)
Total Protein: 6.8 g/dL (ref 6.5–8.1)

## 2024-07-30 LAB — CBC
HCT: 34.9 % — ABNORMAL LOW (ref 36.0–46.0)
HCT: 37.9 % (ref 36.0–46.0)
Hemoglobin: 11.2 g/dL — ABNORMAL LOW (ref 12.0–15.0)
Hemoglobin: 12.2 g/dL (ref 12.0–15.0)
MCH: 26.2 pg (ref 26.0–34.0)
MCH: 26.4 pg (ref 26.0–34.0)
MCHC: 32.1 g/dL (ref 30.0–36.0)
MCHC: 32.2 g/dL (ref 30.0–36.0)
MCV: 81.5 fL (ref 80.0–100.0)
MCV: 82.3 fL (ref 80.0–100.0)
Platelets: 248 K/uL (ref 150–400)
Platelets: 297 K/uL (ref 150–400)
RBC: 4.24 MIL/uL (ref 3.87–5.11)
RBC: 4.65 MIL/uL (ref 3.87–5.11)
RDW: 15.2 % (ref 11.5–15.5)
RDW: 15.3 % (ref 11.5–15.5)
WBC: 5.9 K/uL (ref 4.0–10.5)
WBC: 8.4 K/uL (ref 4.0–10.5)
nRBC: 0 % (ref 0.0–0.2)
nRBC: 0 % (ref 0.0–0.2)

## 2024-07-30 LAB — CREATININE, SERUM
Creatinine, Ser: 0.69 mg/dL (ref 0.44–1.00)
GFR, Estimated: >60 mL/min (ref >60)

## 2024-07-30 LAB — PROTEIN / CREATININE RATIO, URINE
Creatinine, Urine: 114 mg/dL
Protein Creatinine Ratio: 0.07 mg/mg{creat} (ref 0.00–0.15)
Total Protein, Urine: 8 mg/dL

## 2024-07-30 LAB — TYPE AND SCREEN
ABO/RH(D): O POS
Antibody Screen: NEGATIVE

## 2024-07-30 LAB — RPR: RPR Ser Ql: NONREACTIVE

## 2024-07-30 SURGERY — Surgical Case
Anesthesia: Epidural | Site: Uterus

## 2024-07-30 MED ORDER — LIDOCAINE-EPINEPHRINE (PF) 2 %-1:200000 IJ SOLN
INTRAMUSCULAR | Status: DC | PRN
  Administered 2024-07-30: 10 mL via EPIDURAL

## 2024-07-30 MED ORDER — LIDOCAINE HCL (PF) 1 % IJ SOLN
INTRAMUSCULAR | Status: DC | PRN
  Administered 2024-07-30: 8 mL via EPIDURAL

## 2024-07-30 MED ORDER — METRONIDAZOLE 500 MG PO TABS
500.0000 mg | ORAL_TABLET | Freq: Two times a day (BID) | ORAL | Status: DC
  Administered 2024-07-30 – 2024-08-01 (×5): 500 mg via ORAL
  Filled 2024-07-30 (×8): qty 1

## 2024-07-30 MED ORDER — TRANEXAMIC ACID-NACL 1000-0.7 MG/100ML-% IV SOLN
INTRAVENOUS | Status: DC | PRN
  Administered 2024-07-30: 1000 mg via INTRAVENOUS

## 2024-07-30 MED ORDER — COCONUT OIL OIL: 1.0000 | TOPICAL_OIL | Status: DC | PRN

## 2024-07-30 MED ORDER — POTASSIUM CHLORIDE CRYS ER 20 MEQ PO TBCR
20.0000 meq | EXTENDED_RELEASE_TABLET | Freq: Every day | ORAL | Status: DC
  Administered 2024-07-30 – 2024-08-01 (×3): 20 meq via ORAL
  Filled 2024-07-30 (×3): qty 1

## 2024-07-30 MED ORDER — MISOPROSTOL 25 MCG QUARTER TABLET
25.0000 ug | ORAL_TABLET | ORAL | Status: DC
  Administered 2024-07-30: 25 ug via ORAL
  Filled 2024-07-30: qty 1

## 2024-07-30 MED ORDER — ONDANSETRON HCL 4 MG/2ML IJ SOLN
INTRAMUSCULAR | Status: DC | PRN
  Administered 2024-07-30: 4 mg via INTRAVENOUS

## 2024-07-30 MED ORDER — MORPHINE SULFATE (PF) 0.5 MG/ML IJ SOLN
INTRAMUSCULAR | Status: AC
  Filled 2024-07-30: qty 10

## 2024-07-30 MED ORDER — SIMETHICONE 80 MG PO CHEW
80.0000 mg | CHEWABLE_TABLET | Freq: Three times a day (TID) | ORAL | Status: DC
  Administered 2024-07-31 – 2024-08-01 (×5): 80 mg via ORAL
  Filled 2024-07-30 (×5): qty 1

## 2024-07-30 MED ORDER — OXYTOCIN-SODIUM CHLORIDE 30-0.9 UT/500ML-% IV SOLN
INTRAVENOUS | Status: AC
Start: 2024-07-30 — End: 2024-07-30
  Filled 2024-07-30: qty 500

## 2024-07-30 MED ORDER — KETOROLAC TROMETHAMINE 30 MG/ML IJ SOLN
30.0000 mg | Freq: Four times a day (QID) | INTRAMUSCULAR | Status: AC
  Administered 2024-07-30 – 2024-07-31 (×3): 30 mg via INTRAVENOUS
  Filled 2024-07-30 (×3): qty 1

## 2024-07-30 MED ORDER — LEVONORGESTREL 20 MCG/DAY IU IUD
INTRAUTERINE_SYSTEM | INTRAUTERINE | Status: AC
  Filled 2024-07-30: qty 1

## 2024-07-30 MED ORDER — PHENYLEPHRINE 80 MCG/ML (10ML) SYRINGE FOR IV PUSH (FOR BLOOD PRESSURE SUPPORT)
PREFILLED_SYRINGE | INTRAVENOUS | Status: AC
  Filled 2024-07-30: qty 10

## 2024-07-30 MED ORDER — LACTATED RINGERS AMNIOINFUSION: INTRAVENOUS | Status: DC

## 2024-07-30 MED ORDER — SODIUM CHLORIDE 0.9 % IV SOLN
INTRAVENOUS | Status: AC
  Filled 2024-07-30: qty 5

## 2024-07-30 MED ORDER — STERILE WATER FOR IRRIGATION IR SOLN
Status: DC | PRN
  Administered 2024-07-30: 1000 mL

## 2024-07-30 MED ORDER — KETOROLAC TROMETHAMINE 30 MG/ML IJ SOLN
INTRAMUSCULAR | Status: AC
  Filled 2024-07-30: qty 1

## 2024-07-30 MED ORDER — FENTANYL CITRATE (PF) 100 MCG/2ML IJ SOLN
INTRAMUSCULAR | Status: AC
  Filled 2024-07-30: qty 2

## 2024-07-30 MED ORDER — SOD CITRATE-CITRIC ACID 500-334 MG/5ML PO SOLN
30.0000 mL | Freq: Once | ORAL | Status: AC
  Administered 2024-07-30: 30 mL via ORAL

## 2024-07-30 MED ORDER — FUROSEMIDE 20 MG PO TABS
20.0000 mg | ORAL_TABLET | Freq: Every day | ORAL | Status: DC
  Administered 2024-07-30 – 2024-08-01 (×3): 20 mg via ORAL
  Filled 2024-07-30 (×3): qty 1

## 2024-07-30 MED ORDER — PHENYLEPHRINE 80 MCG/ML (10ML) SYRINGE FOR IV PUSH (FOR BLOOD PRESSURE SUPPORT)
80.0000 ug | PREFILLED_SYRINGE | INTRAVENOUS | Status: DC | PRN
  Filled 2024-07-30: qty 10

## 2024-07-30 MED ORDER — PHENYLEPHRINE HCL-NACL 20-0.9 MG/250ML-% IV SOLN
INTRAVENOUS | Status: AC
  Filled 2024-07-30: qty 250

## 2024-07-30 MED ORDER — OXYTOCIN-SODIUM CHLORIDE 30-0.9 UT/500ML-% IV SOLN: 1.0000 m[IU]/min | INTRAVENOUS | Status: DC

## 2024-07-30 MED ORDER — SIMETHICONE 80 MG PO CHEW: 80.0000 mg | CHEWABLE_TABLET | ORAL | Status: DC | PRN

## 2024-07-30 MED ORDER — PHENYLEPHRINE HCL-NACL 20-0.9 MG/250ML-% IV SOLN
INTRAVENOUS | Status: DC | PRN
  Administered 2024-07-30: 60 ug/min via INTRAVENOUS

## 2024-07-30 MED ORDER — POTASSIUM CHLORIDE 10 MEQ/100ML IV SOLN: 10.0000 meq | INTRAVENOUS | Status: DC

## 2024-07-30 MED ORDER — OXYCODONE HCL 5 MG PO TABS: 5.0000 mg | ORAL_TABLET | ORAL | Status: DC | PRN

## 2024-07-30 MED ORDER — DEXMEDETOMIDINE HCL IN NACL 80 MCG/20ML IV SOLN
INTRAVENOUS | Status: AC
  Filled 2024-07-30: qty 20

## 2024-07-30 MED ORDER — DIPHENHYDRAMINE HCL 50 MG/ML IJ SOLN: 12.5000 mg | INTRAMUSCULAR | Status: DC | PRN

## 2024-07-30 MED ORDER — ONDANSETRON HCL 4 MG/2ML IJ SOLN
INTRAMUSCULAR | Status: AC
  Filled 2024-07-30: qty 2

## 2024-07-30 MED ORDER — ONDANSETRON HCL 4 MG/2ML IJ SOLN
4.0000 mg | Freq: Three times a day (TID) | INTRAMUSCULAR | Status: DC | PRN
Start: 2024-07-30 — End: 2024-08-01
  Administered 2024-07-30: 4 mg via INTRAVENOUS
  Filled 2024-07-30: qty 2

## 2024-07-30 MED ORDER — FENTANYL CITRATE (PF) 100 MCG/2ML IJ SOLN
INTRAMUSCULAR | Status: DC | PRN
  Administered 2024-07-30: 100 ug via EPIDURAL

## 2024-07-30 MED ORDER — DIPHENHYDRAMINE HCL 25 MG PO CAPS
25.0000 mg | ORAL_CAPSULE | Freq: Four times a day (QID) | ORAL | Status: DC | PRN
  Administered 2024-07-30: 25 mg via ORAL
  Filled 2024-07-30: qty 1

## 2024-07-30 MED ORDER — DEXMEDETOMIDINE HCL IN NACL 80 MCG/20ML IV SOLN
INTRAVENOUS | Status: DC | PRN
Start: 2024-07-30 — End: 2024-07-30
  Administered 2024-07-30 (×2): 8 ug via INTRAVENOUS

## 2024-07-30 MED ORDER — PHENYLEPHRINE 80 MCG/ML (10ML) SYRINGE FOR IV PUSH (FOR BLOOD PRESSURE SUPPORT)
80.0000 ug | PREFILLED_SYRINGE | INTRAVENOUS | Status: DC | PRN
  Administered 2024-07-30 (×2): 80 ug via INTRAVENOUS

## 2024-07-30 MED ORDER — ENOXAPARIN SODIUM 100 MG/ML IJ SOSY
90.0000 mg | PREFILLED_SYRINGE | INTRAMUSCULAR | Status: DC
  Administered 2024-07-31 – 2024-08-01 (×2): 90 mg via SUBCUTANEOUS
  Filled 2024-07-30 (×3): qty 0.9

## 2024-07-30 MED ORDER — PHENYLEPHRINE 80 MCG/ML (10ML) SYRINGE FOR IV PUSH (FOR BLOOD PRESSURE SUPPORT)
PREFILLED_SYRINGE | INTRAVENOUS | Status: DC | PRN
  Administered 2024-07-30 (×4): 80 ug via INTRAVENOUS
  Administered 2024-07-30: 160 ug via INTRAVENOUS

## 2024-07-30 MED ORDER — ACETAMINOPHEN 500 MG PO TABS
1000.0000 mg | ORAL_TABLET | Freq: Four times a day (QID) | ORAL | Status: DC
  Administered 2024-07-31 – 2024-08-01 (×6): 1000 mg via ORAL
  Filled 2024-07-30 (×6): qty 2

## 2024-07-30 MED ORDER — HYDROMORPHONE HCL 1 MG/ML IJ SOLN: 0.2000 mg | INTRAMUSCULAR | Status: DC | PRN

## 2024-07-30 MED ORDER — DIBUCAINE (PERIANAL) 1 % EX OINT: 1.0000 | TOPICAL_OINTMENT | CUTANEOUS | Status: DC | PRN

## 2024-07-30 MED ORDER — OXYTOCIN-SODIUM CHLORIDE 30-0.9 UT/500ML-% IV SOLN
INTRAVENOUS | Status: DC | PRN
  Administered 2024-07-30: 30 [IU] via INTRAVENOUS

## 2024-07-30 MED ORDER — MORPHINE SULFATE (PF) 0.5 MG/ML IJ SOLN
INTRAMUSCULAR | Status: DC | PRN
  Administered 2024-07-30: 3 mg via EPIDURAL

## 2024-07-30 MED ORDER — LACTATED RINGERS IV SOLN
500.0000 mL | Freq: Once | INTRAVENOUS | Status: AC
  Administered 2024-07-30: 500 mL via INTRAVENOUS

## 2024-07-30 MED ORDER — OXYTOCIN-SODIUM CHLORIDE 30-0.9 UT/500ML-% IV SOLN: 2.5000 [IU]/h | INTRAVENOUS | Status: AC

## 2024-07-30 MED ORDER — ACETAMINOPHEN 10 MG/ML IV SOLN
INTRAVENOUS | Status: DC | PRN
  Administered 2024-07-30: 1000 mg via INTRAVENOUS

## 2024-07-30 MED ORDER — SOD CITRATE-CITRIC ACID 500-334 MG/5ML PO SOLN: 30.0000 mL | ORAL | Status: DC

## 2024-07-30 MED ORDER — IBUPROFEN 600 MG PO TABS
600.0000 mg | ORAL_TABLET | Freq: Four times a day (QID) | ORAL | Status: DC
  Administered 2024-07-31 – 2024-08-01 (×5): 600 mg via ORAL
  Filled 2024-07-30 (×5): qty 1

## 2024-07-30 MED ORDER — ACETAMINOPHEN 10 MG/ML IV SOLN
INTRAVENOUS | Status: AC
  Filled 2024-07-30: qty 100

## 2024-07-30 MED ORDER — TRANEXAMIC ACID-NACL 1000-0.7 MG/100ML-% IV SOLN
INTRAVENOUS | Status: AC
  Filled 2024-07-30: qty 100

## 2024-07-30 MED ORDER — GABAPENTIN 100 MG PO CAPS
100.0000 mg | ORAL_CAPSULE | Freq: Two times a day (BID) | ORAL | Status: DC
  Administered 2024-07-30 – 2024-08-01 (×4): 100 mg via ORAL
  Filled 2024-07-30 (×4): qty 1

## 2024-07-30 MED ORDER — EPHEDRINE 5 MG/ML INJ: 10.0000 mg | INTRAVENOUS | Status: DC | PRN

## 2024-07-30 MED ORDER — LEVONORGESTREL 20 MCG/DAY IU IUD
1.0000 | INTRAUTERINE_SYSTEM | Freq: Once | INTRAUTERINE | Status: AC
  Administered 2024-07-30: 1 via INTRAUTERINE

## 2024-07-30 MED ORDER — SODIUM CHLORIDE 0.9 % IR SOLN
Status: DC | PRN
  Administered 2024-07-30: 550 mL

## 2024-07-30 MED ORDER — SENNOSIDES-DOCUSATE SODIUM 8.6-50 MG PO TABS
2.0000 | ORAL_TABLET | Freq: Every day | ORAL | Status: DC
  Administered 2024-07-31: 2 via ORAL
  Filled 2024-07-30 (×2): qty 2

## 2024-07-30 MED ORDER — TERBUTALINE SULFATE 1 MG/ML IJ SOLN
0.2500 mg | Freq: Once | INTRAMUSCULAR | Status: AC | PRN
  Administered 2024-07-30: 0.25 mg via SUBCUTANEOUS

## 2024-07-30 MED ORDER — WITCH HAZEL-GLYCERIN EX PADS
1.0000 | MEDICATED_PAD | CUTANEOUS | Status: DC | PRN
Start: 2024-07-30 — End: 2024-08-01

## 2024-07-30 MED ORDER — ZOLPIDEM TARTRATE 5 MG PO TABS: 5.0000 mg | ORAL_TABLET | Freq: Every evening | ORAL | Status: DC | PRN

## 2024-07-30 MED ORDER — CEFAZOLIN SODIUM-DEXTROSE 3-4 GM/150ML-% IV SOLN
3.0000 g | INTRAVENOUS | Status: AC
  Administered 2024-07-30: 3 g via INTRAVENOUS

## 2024-07-30 MED ORDER — SODIUM BICARBONATE 8.4 % IV SOLN: INTRAVENOUS | Status: DC | PRN

## 2024-07-30 MED ORDER — SODIUM CHLORIDE 0.9 % IV SOLN
500.0000 mg | INTRAVENOUS | Status: AC
  Administered 2024-07-30: 500 mg via INTRAVENOUS

## 2024-07-30 MED ORDER — MENTHOL 3 MG MT LOZG: 1.0000 | LOZENGE | OROMUCOSAL | Status: DC | PRN

## 2024-07-30 MED ORDER — FENTANYL-BUPIVACAINE-NACL 0.5-0.125-0.9 MG/250ML-% EP SOLN
12.0000 mL/h | EPIDURAL | Status: DC | PRN
  Administered 2024-07-30: 12 mL/h via EPIDURAL
  Filled 2024-07-30: qty 250

## 2024-07-30 MED ORDER — PRENATAL MULTIVITAMIN CH
1.0000 | ORAL_TABLET | Freq: Every day | ORAL | Status: DC
  Administered 2024-07-31 – 2024-08-01 (×2): 1 via ORAL
  Filled 2024-07-30 (×2): qty 1

## 2024-07-30 MED ORDER — KETOROLAC TROMETHAMINE 30 MG/ML IJ SOLN
INTRAMUSCULAR | Status: DC | PRN
  Administered 2024-07-30: 30 mg via INTRAVENOUS

## 2024-07-30 SURGICAL SUPPLY — 31 items
CHLORAPREP W/TINT 26 (MISCELLANEOUS) ×4 IMPLANT
CLAMP UMBILICAL CORD (MISCELLANEOUS) ×2 IMPLANT
CLOTH BEACON ORANGE TIMEOUT ST (SAFETY) ×2 IMPLANT
DERMABOND ADVANCED .7 DNX12 (GAUZE/BANDAGES/DRESSINGS) IMPLANT
DRESSING PREVENA PLUS CUSTOM (GAUZE/BANDAGES/DRESSINGS) IMPLANT
DRSG OPSITE POSTOP 4X10 (GAUZE/BANDAGES/DRESSINGS) ×2 IMPLANT
ELECTRODE REM PT RTRN 9FT ADLT (ELECTROSURGICAL) ×2 IMPLANT
EXTRACTOR VACUUM KIWI (MISCELLANEOUS) IMPLANT
GLOVE BIO SURGEON STRL SZ 6 (GLOVE) ×2 IMPLANT
GLOVE BIOGEL PI IND STRL 7.0 (GLOVE) ×4 IMPLANT
GOWN STRL REUS W/TWL LRG LVL3 (GOWN DISPOSABLE) ×4 IMPLANT
KIT ABG SYR 3ML LUER SLIP (SYRINGE) IMPLANT
MAT PREVALON FULL STRYKER (MISCELLANEOUS) IMPLANT
NDL HYPO 25X5/8 SAFETYGLIDE (NEEDLE) IMPLANT
NEEDLE HYPO 22GX1.5 SAFETY (NEEDLE) IMPLANT
NEEDLE HYPO 25X5/8 SAFETYGLIDE (NEEDLE) IMPLANT
NS IRRIG 1000ML POUR BTL (IV SOLUTION) ×2 IMPLANT
PACK C SECTION WH (CUSTOM PROCEDURE TRAY) ×2 IMPLANT
PAD OB MATERNITY 4.3X12.25 (PERSONAL CARE ITEMS) ×2 IMPLANT
RETRACTOR TRAXI PANNICULUS (MISCELLANEOUS) IMPLANT
RETRACTOR WND ALEXIS 25 LRG (MISCELLANEOUS) IMPLANT
SUT MON AB 4-0 PS1 27 (SUTURE) ×2 IMPLANT
SUT PLAIN 0 NONE (SUTURE) ×2 IMPLANT
SUT VIC AB 0 CT1 36 (SUTURE) ×2 IMPLANT
SUT VIC AB 0 CTX36XBRD ANBCTRL (SUTURE) ×4 IMPLANT
SUT VIC AB 2-0 CT1 TAPERPNT 27 (SUTURE) ×2 IMPLANT
SUTURE PLAIN GUT 2.0 ETHICON (SUTURE) IMPLANT
SYR CONTROL 10ML LL (SYRINGE) IMPLANT
TOWEL OR 17X24 6PK STRL BLUE (TOWEL DISPOSABLE) ×2 IMPLANT
TRAY FOLEY W/BAG SLVR 14FR LF (SET/KITS/TRAYS/PACK) IMPLANT
WATER STERILE IRR 1000ML POUR (IV SOLUTION) ×2 IMPLANT

## 2024-07-30 NOTE — Progress Notes (Addendum)
 Tracing reviewed at request of [place], [place].   Reviewed tracing prior to epidural as well. FHT were with moderate variability as of 12:55. After epidural, has been minimal variability. She had a prolonged decel x2 minutes. She had one clear contraction with IUPC in.   We will not start pitocin at this time. We will give fetus time for recovery and adjust to changes post-epidural. If not improved with usual measures we discussed trying IUPC.   If these measures are not successful, then we will re-evaluate mode of delivery.   On further review, BP prior to epidural was intermittently mild range and is now 90-100s/50-60s.  Recommend also addressing BP post-epidural.   Vina Solian, MD Attending Obstetrician & Gynecologist, [facility] for [facility], [facility]

## 2024-07-30 NOTE — Anesthesia Postprocedure Evaluation (Signed)
 Anesthesia Post Note  Patient: Kerri Medina  Procedure(s) Performed: CESAREAN DELIVERY (Abdomen) INSERTION, INTRAUTERINE DEVICE (Uterus)     Patient location during evaluation: Mother Baby Anesthesia Type: Epidural Level of consciousness: awake and alert Pain management: pain level controlled Vital Signs Assessment: post-procedure vital signs reviewed and stable Respiratory status: spontaneous breathing, nonlabored ventilation and respiratory function stable Cardiovascular status: stable Postop Assessment: no headache, no backache and epidural receding Anesthetic complications: no   No notable events documented.  Last Vitals:  Vitals:   07/30/24 1711 07/30/24 1816  BP: 104/69 103/60  Pulse: 77 77  Resp: 16 16  Temp: 36.6 C 36.6 C  SpO2: 97% 96%    Last Pain:  Vitals:   07/30/24 1816  TempSrc: Oral  PainSc: 4    Pain Goal: Patients Stated Pain Goal: 0 (07/30/24 1712)                 Danikah Budzik

## 2024-07-30 NOTE — Progress Notes (Signed)
 Labor Progress Note Kerri Medina is a 43 y.o. [phone] at [redacted]w[redacted]d presented for IOL d/t ho IUFD  S:  Coping well.   O:  BP 127/66   Pulse 83   Temp 98.2 F (36.8 C) (Oral)   Resp 16   Ht 5' 6 (1.676 m)   Wt (!) 179.9 kg   LMP 10/24/2023 (Approximate)   BMI 64.01 kg/m   EFM: baseline 135 bpm/ moderate variability/ absent accels/ variable decels  Toco/IUPC: UI SVE: Dilation: Closed Effacement (%): Thick Station: -3 Presentation: Vertex (Via bedside ultrasound) Exam by:: Randall, RN Pitocin: 0 mu/min  A/P: 43 y.o. [id number] [redacted]w[redacted]d  1. Labor: IOL in latent phase with cervical ripening indicated.  RBA of Cook's balloon d/w patient, patient desires to proceed with this intervention. Placed, tolerated well. 60mL instilled by RN.  2. FWB: Cat 2, reassuring overall.  3. Pain: Coping well, one dose IV pain medication 2/2 balloon placement.  4. Trich: Consulted pharmacy for best management due to labor and multiple scripts over course of pregnancy.    Anticipate SVB.  Kerri Medina, CNM 5:36 AM

## 2024-07-30 NOTE — Op Note (Signed)
 [name] PROCEDURE DATE: 07/30/2024  PREOPERATIVE DIAGNOSES: Intrauterine pregnancy at [redacted]w[redacted]d weeks gestation; non-reassuring fetal status  POSTOPERATIVE DIAGNOSES: The same plus asynclitic  PROCEDURE: Primary Low Transverse Cesarean Section  SURGEON:  Dr. Vina Solian  ASSISTANT:  Dr. Jenny Ozan, An experienced assistant was required given the standard of surgical care given the complexity of the case.  This assistant was needed for exposure, dissection, suctioning, retraction, instrument exchange, and for overall help during the procedure.  SECOND ASSIST: Dr. Fairy Amy  ANESTHESIOLOGY TEAM: Anesthesiologist: Mallory Manus, MD CRNA: Edelmiro Elenor NOVAK, CRNA  INDICATIONS: Kerri Medina is a 43 y.o. [phone] at [redacted]w[redacted]d here for cesarean section secondary to the indications listed under preoperative diagnoses; please see preoperative note for further details.  The risks of surgery were discussed with the patient including but were not limited to: bleeding which may require transfusion or reoperation; infection which may require antibiotics; injury to bowel, bladder, ureters or other surrounding organs; injury to the fetus; need for additional procedures including hysterectomy in the event of a life-threatening hemorrhage; formation of adhesions; placental abnormalities wth subsequent pregnancies; incisional problems; thromboembolic phenomenon and other postoperative/anesthesia complications.  The patient concurred with the proposed plan, giving informed written consent for the procedure.    FINDINGS:  Viable female infant in cephalic but aynclitic presentation.  Apgars 9 and 9.  Amniotic fluid: clear.  Intact, small placenta, three vessel cord.  Normal uterus, fallopian tubes and ovaries bilaterally.  ANESTHESIA: epidural INTRAVENOUS FLUIDS: 1700 ml   ESTIMATED BLOOD LOSS: 516 ml URINE OUTPUT:  100 ml clear urine SPECIMENS: Placenta sent to pathology  COMPLICATIONS: None  immediate  PROCEDURE IN DETAIL:  The patient preoperatively received intravenous antibiotics and had sequential compression devices applied to her lower extremities.  She was then taken to the operating room where the epidural was dosed up to a surgical level and found to be adequate. She was then placed in a dorsal supine position with a leftward tilt, and prepped and draped in a sterile manner.  A foley catheter was  was already in place.  After an adequate timeout was performed, a Pfannenstiel skin incision was made with scalpel and carried through to the underlying layer of fascia. The fascia was incised in the midline, and this incision was extended bluntly. The rectus muscles were separated in the midline and the peritoneum was entered bluntly.   The Alexis self-retaining retractor was introduced into the abdominal cavity.  Attention was turned to the lower uterine segment where a low transverse hysterotomy was made with a scalpel and extended bilaterally bluntly.  The infant was successfully delivered, the cord was clamped and cut after one minute, and the infant was handed over to the awaiting neonatology team. Uterine massage was then administered, and the placenta delivered intact with a three-vessel cord. The uterus was then cleared of clots and debris.  The hysterotomy was closed with 0 Vicryl in a running fashion.  A second layer of the same suture was used to achieve hemostasis along the length of the incision.   The pelvis was cleared of all clot and debris. Hemostasis was confirmed on all surfaces.  The retractor was removed.  The peritoneum was closed with a 2-0 Vicryl running stitch. The fascia was then closed using 0 Vicryl in a running fashion.  The subcutaneous layer was irrigated, any areas of bleeding were cauterized with the bovie and was reapproximated with 2-0 plain gut in a running fashion and 2 layers of this  was done. The skin was closed with a 4-0 monocryl subcuticular stitch.  The patient tolerated the procedure well. Sponge, instrument and needle counts were correct x 3.  She was taken to the recovery room in stable condition.   Vina Solian, MD Attending Obstetrician & Gynecologist, [facility] for [facility], [facility]

## 2024-07-30 NOTE — Progress Notes (Addendum)
 Labor Progress Note Kerri Medina is a 43 y.o. [phone] at [redacted]w[redacted]d by ultrasound presented for IOL for hx of term IUFD  S:  Coping well. Beginning to feel contractions.  O:  BP (!) 141/84   Pulse 84   Temp 97.6 F (36.4 C) (Oral)   Resp 18   Ht 5' 6 (1.676 m)   Wt (!) 179.9 kg   LMP 10/24/2023 (Approximate)   BMI 64.01 kg/m   EFM: baseline 135 135 bpm/ moderate variability/ 10x10 accels/ none decels  Toco/IUPC: Difficult to trace. Toco adjusted. Pt states feeling contractions. SVE: Dilation: 5.5 Effacement (%): 80 Station: -2 Presentation: Vertex Exam by:: JINNY Mt, RN Pitocin: To start 2x2  A/P: 43 y.o. G4P3002 103w1d  1. Labor: IOL in latent phase s/p foley bulb removal. SDM with patient regarding SROM with FSE and IUPC and patient agreeable. Unable to AROM due to fetal station -3. Discussed option of starting pitocin now and reevaluating the other methods at next exam. Patient agreeable.  2. FWB: Cat 2. Overall reassuring. Blood noted on glove as well as marble size clot when removing hand after vaginal exam. Will continue to monitor. 3. Pain: Coping well. 4. Social support: limited, living at [facility]   Anticipate SVB.  [name] Me, Student-MidWife 9:31 AM

## 2024-07-30 NOTE — Anesthesia Preprocedure Evaluation (Addendum)
 Anesthesia Evaluation  Patient identified by MRN, date of birth, ID band Patient awake    Reviewed: Allergy & Precautions, H&P , NPO status , Patient's Chart, lab work & pertinent test results, reviewed documented beta blocker date and time   Airway Mallampati: II  TM Distance: >3 FB Neck ROM: full    Dental no notable dental hx. (+) Edentulous Upper, Poor Dentition, Dental Advisory Given, Upper Dentures,    Pulmonary former smoker   Pulmonary exam normal breath sounds clear to auscultation       Cardiovascular negative cardio ROS Normal cardiovascular exam Rhythm:regular Rate:Normal     Neuro/Psych negative neurological ROS  negative psych ROS   GI/Hepatic negative GI ROS, Neg liver ROS,,,  Endo/Other    Class 4 obesity  Renal/GU negative Renal ROS  negative genitourinary   Musculoskeletal negative musculoskeletal ROS (+)    Abdominal   Peds negative pediatric ROS (+)  Hematology negative hematology ROS (+)   Anesthesia Other Findings   Reproductive/Obstetrics negative OB ROS                              Anesthesia Physical Anesthesia Plan  ASA: 3  Anesthesia Plan: Epidural   Post-op Pain Management: Minimal or no pain anticipated   Induction:   PONV Risk Score and Plan: 2  Airway Management Planned: Natural Airway and Simple Face Mask  Additional Equipment: Fetal Monitoring  Intra-op Plan:   Post-operative Plan:   Informed Consent: I have reviewed the patients History and Physical, chart, labs and discussed the procedure including the risks, benefits and alternatives for the proposed anesthesia with the patient or authorized representative who has indicated his/her understanding and acceptance.     Dental Advisory Given  Plan Discussed with: Anesthesiologist  Anesthesia Plan Comments: (Labs checked- platelets confirmed with RN in room. Fetal heart tracing, per RN,  reported to be stable enough for sitting procedure. Discussed epidural, and patient consents to the procedure:  included risk of possible headache,backache, failed block, allergic reaction, and nerve injury. This patient was asked if she had any questions or concerns before the procedure started.)         Anesthesia Quick Evaluation

## 2024-07-30 NOTE — Discharge Summary (Deleted)
 [name] PROCEDURE DATE: 07/30/2024  PREOPERATIVE DIAGNOSES: Intrauterine pregnancy at [redacted]w[redacted]d weeks gestation; non-reassuring fetal status  POSTOPERATIVE DIAGNOSES: The same plus asynclitic  PROCEDURE: Primary Low Transverse Cesarean Section  SURGEON:  Dr. Vina Solian  ASSISTANT:  Dr. Jenny Ozan, An experienced assistant was required given the standard of surgical care given the complexity of the case.  This assistant was needed for exposure, dissection, suctioning, retraction, instrument exchange, and for overall help during the procedure.  SECOND ASSIST: Dr. Fairy Amy  ANESTHESIOLOGY TEAM: Anesthesiologist: Mallory Manus, MD CRNA: Edelmiro Elenor NOVAK, CRNA  INDICATIONS: Kerri Medina is a 43 y.o. [phone] at [redacted]w[redacted]d here for cesarean section secondary to the indications listed under preoperative diagnoses; please see preoperative note for further details.  The risks of surgery were discussed with the patient including but were not limited to: bleeding which may require transfusion or reoperation; infection which may require antibiotics; injury to bowel, bladder, ureters or other surrounding organs; injury to the fetus; need for additional procedures including hysterectomy in the event of a life-threatening hemorrhage; formation of adhesions; placental abnormalities wth subsequent pregnancies; incisional problems; thromboembolic phenomenon and other postoperative/anesthesia complications.  The patient concurred with the proposed plan, giving informed written consent for the procedure.    FINDINGS:  Viable female infant in cephalic but aynclitic presentation.  Apgars 9 and 9.  Amniotic fluid: clear.  Intact, small placenta, three vessel cord.  Normal uterus, fallopian tubes and ovaries bilaterally.  ANESTHESIA: epidural INTRAVENOUS FLUIDS: 1700 ml   ESTIMATED BLOOD LOSS: 516 ml URINE OUTPUT:  100 ml clear urine SPECIMENS: Placenta sent to pathology  COMPLICATIONS: None  immediate  PROCEDURE IN DETAIL:  The patient preoperatively received intravenous antibiotics and had sequential compression devices applied to her lower extremities.  She was then taken to the operating room where the epidural was dosed up to a surgical level and found to be adequate. She was then placed in a dorsal supine position with a leftward tilt, and prepped and draped in a sterile manner.  A foley catheter was  was already in place.  After an adequate timeout was performed, a Pfannenstiel skin incision was made with scalpel and carried through to the underlying layer of fascia. The fascia was incised in the midline, and this incision was extended bluntly. The rectus muscles were separated in the midline and the peritoneum was entered bluntly.   The Alexis self-retaining retractor was introduced into the abdominal cavity.  Attention was turned to the lower uterine segment where a low transverse hysterotomy was made with a scalpel and extended bilaterally bluntly.  The infant was successfully delivered, the cord was clamped and cut after one minute, and the infant was handed over to the awaiting neonatology team. Uterine massage was then administered, and the placenta delivered intact with a three-vessel cord. The uterus was then cleared of clots and debris.  The hysterotomy was closed with 0 Vicryl in a running fashion.  A second layer of the same suture was used to achieve hemostasis along the length of the incision.   The pelvis was cleared of all clot and debris. Hemostasis was confirmed on all surfaces.  The retractor was removed.  The peritoneum was closed with a 2-0 Vicryl running stitch. The fascia was then closed using 0 Vicryl in a running fashion.  The subcutaneous layer was irrigated, any areas of bleeding were cauterized with the bovie and was reapproximated with 2-0 plain gut in a running fashion and 2 layers of this  was done. The skin was closed with a 4-0 monocryl subcuticular stitch.  The patient tolerated the procedure well. Sponge, instrument and needle counts were correct x 3.  She was taken to the recovery room in stable condition.   Vina Solian, MD Attending Obstetrician & Gynecologist, [facility] for [facility], [facility]

## 2024-07-30 NOTE — Anesthesia Procedure Notes (Signed)
 Epidural Patient location during procedure: OB Start time: 07/30/2024 12:14 PM End time: 07/30/2024 12:20 PM  Staffing Anesthesiologist: Mallory Manus, MD  Preanesthetic Checklist Completed: patient identified, IV checked, site marked, risks and benefits discussed, surgical consent, monitors and equipment checked, pre-op evaluation and timeout performed  Epidural Patient position: sitting Prep: DuraPrep and site prepped and draped Patient monitoring: continuous pulse ox and blood pressure Approach: midline Location: L4-L5 Injection technique: LOR air  Needle:  Needle type: Tuohy  Needle gauge: 17 G Needle length: 9 cm and 9 Needle insertion depth: 9 cm Catheter type: closed end flexible Catheter size: 19 Gauge Catheter at skin depth: 15 cm Test dose: negative  Assessment Events: blood not aspirated, no cerebrospinal fluid, injection not painful, no injection resistance, no paresthesia and negative IV test

## 2024-07-30 NOTE — Transfer of Care (Signed)
 Immediate Anesthesia Transfer of Care Note  Patient: Kerri Medina  Procedure(s) Performed: CESAREAN DELIVERY (Abdomen) INSERTION, INTRAUTERINE DEVICE (Uterus)  Patient Location: PACU  Anesthesia Type:Epidural  Level of Consciousness: awake, alert , and oriented  Airway & Oxygen Therapy: Patient Spontanous Breathing  Post-op Assessment: Report given to RN and Post -op Vital signs reviewed and stable  Post vital signs: Reviewed and stable  Last Vitals:  Vitals Value Taken Time  BP    Temp    Pulse    Resp    SpO2      Last Pain:  Vitals:   07/30/24 1334  TempSrc:   PainSc: 0-No pain         Complications: No notable events documented.

## 2024-07-30 NOTE — Progress Notes (Signed)
 Labor Progress Note Kerri Medina is a 43 y.o. [phone] at [redacted]w[redacted]d presented for IOL for hx of term IUFD.  S:  Coping well.  O:  BP 129/76   Pulse 87   Temp 97.6 F (36.4 C) (Oral)   Resp 18   Ht 5' 6 (1.676 m)   Wt (!) 179.9 kg   LMP 10/24/2023 (Approximate)   SpO2 100%   BMI 64.01 kg/m   EFM: baseline 140 bpm/ minimal variability/ no accels/ no decels  Toco/IUPC: occasional SVE: Dilation: 5.5 Effacement (%): 80 Cervical Position: Posterior Station: -3 Presentation: Vertex Exam by:: Conard Me, SNM Pitocin: 2 mu/min  A/P: 43 y.o. [id number] [redacted]w[redacted]d  1. Labor: Cervical exam difficult for patient. Would like to get epidural before continuing exam. 2. FWB: Cat 2. Fluid bolus, position change. 3. Pain: requesting epidural 4. Social support: limited, living at [facility]   Anticipate SVB.  Kerri Medina, Student-MidWife 1:00 PM

## 2024-07-30 NOTE — Progress Notes (Signed)
 Labor Progress Note Kerri Medina is a 43 y.o. [phone] at [redacted]w[redacted]d presented for  IOL for hx of term IUFD.    S:  Comfortable with epidural  O:  BP 98/80   Pulse 86   Temp 97.8 F (36.6 C) (Oral)   Resp 14   Ht 5' 6 (1.676 m)   Wt (!) 179.9 kg   LMP 10/24/2023 (Approximate)   SpO2 100%   BMI 64.01 kg/m   EFM: baseline 140 bpm/ moderate/minimal variability/ none accels/ prolonged decels. FSE placed. Toco/IUPC: occasional. IUPC placed. SVE: Dilation: 5.5 Effacement (%): 80 Cervical Position: Posterior Station: -2 Presentation: Vertex Exam by:: Olam Rollie Fuelling Pitocin: 2 mu/min will titrate up as indicated  A/P: 43 y.o. H5E6997 [redacted]w[redacted]d  1. Labor: Cervical exam the same.IUPC placed.  2. FWB: Cat 2. FSE placed. 3. Pain: comfortable with epidural 4. AROM: Clear fluid  Anticipate SVD.  Tiane Szydlowski H Sylvia Kondracki, Student-MidWife 1:09 PM

## 2024-07-30 NOTE — Progress Notes (Signed)
 Repeated prolonged decels with no pitocin. Reviewed with patient while we know this baby can fit, for whatever reason, the baby is not tolerating the limited contractions she is having. I recommend proceeding with cesarean delivery.   The risks of surgery were discussed with the patient including but were not limited to: bleeding which may require transfusion or reoperation; infection which may require antibiotics; injury to bowel, bladder, ureters or other surrounding organs; injury to the fetus; need for additional procedures including hysterectomy in the event of a life-threatening hemorrhage; formation of adhesions; placental abnormalities with subsequent pregnancies; incisional problems; thromboembolic phenomenon and other postoperative/anesthesia complications.    The patient concurred with the proposed plan, giving informed written consent for the procedure.   Patient will remain NPO for procedure. Anesthesia and OR aware. Preoperative prophylactic antibiotics and SCDs ordered on call to the OR.  To OR when ready.  Vina Solian, MD Attending Obstetrician & Gynecologist, [facility] for [facility], [facility]

## 2024-07-30 NOTE — Discharge Summary (Signed)
 Postpartum Discharge Summary  Date of Service updated***     Patient Name: Kerri Medina DOB: [birth year] MRN: [id number]  Date of admission: 07/29/2024 Delivery date:07/30/2024 Delivering provider: CLEATUS MOCCASIN Date of discharge: 07/30/2024  Admitting diagnosis: Encounter for induction of labor [Z34.90] Intrauterine pregnancy: [redacted]w[redacted]d     Secondary diagnosis:  Principal Problem:   Encounter for induction of labor Active Problems:   Supervision of high risk pregnancy in first trimester   Alpha thalassemia silent carrier   BMI 60.0-69.9, adult (HCC)   Prediabetes   Elevated blood pressure reading without diagnosis of hypertension  Additional problems: Obesity, elevated blood pressure without diagnosis of GHTN    Discharge diagnosis: Term Pregnancy Delivered                                              Post partum procedures:{Postpartum procedures:23558} Augmentation: AROM, Pitocin, and IP Foley Complications: {OB Labor/Delivery Complications:20784}  Hospital course: Induction of Labor With Cesarean Section   43 y.o. yo G4P4003 at [redacted]w[redacted]d was admitted to the hospital 07/29/2024 for induction of labor. Patient had a labor course significant for repeated prolonged decels that were initial sporadic but then became recurrent after her epidural was placed. She had an IUPC and FSE which showed minimal contraction causing deceleration. While in labor she had occasional mild range blood pressures but her admission labs were normal, including CMP and P/C ratio. She potentially has chronic HTN.  The patient went for cesarean section due to Non-Reassuring FHR. Delivery details are as follows: Membrane Rupture Time/Date: 12:52 PM,07/30/2024  Delivery Method:C-Section, Low Transverse Operative Delivery:N/A Details of operation can be found in separate operative Note.  Patient had a postpartum course complicated by***. She is ambulating, tolerating a regular diet, passing flatus, and urinating well.   Patient is discharged home in stable condition on 07/30/24.      Newborn Data: Birth date:07/30/2024 Birth time:2:32 PM Gender:Female Living status:Living Apgars:9 ,9  Weight:2740 g                               Magnesium Sulfate received: No BMZ received: No Rhophylac:N/A MMR:N/A T-DaP:{Tdap:23962} Flu: No RSV Vaccine received: No Transfusion:{Transfusion received:30440034}  Immunizations received: Immunization History  Administered Date(s) Administered   PFIZER(Purple Top)SARS-COV-2 Vaccination 08/15/2020, 09/19/2020    Physical exam  Vitals:   07/30/24 1310 07/30/24 1334 07/30/24 1350 07/30/24 1401  BP:  (!) 99/57 103/88 (!) 123/108  Pulse:  88 69   Resp:  16 16   Temp:      TempSrc:      SpO2: 99%     Weight:      Height:       General: {Exam; general:21111117} Lochia: {Desc; appropriate/inappropriate:30686::appropriate} Uterine Fundus: {Desc; firm/soft:30687} Incision: {Exam; incision:21111123} DVT Evaluation: {Exam; dvt:2111122} Labs: Lab Results  Component Value Date   WBC 5.9 07/29/2024   HGB 12.2 07/29/2024   HCT 37.9 07/29/2024   MCV 81.5 07/29/2024   PLT 297 07/29/2024      Latest Ref Rng & Units 07/29/2024   11:56 PM  CMP  Glucose 70 - 99 mg/dL 866   BUN 6 - 20 mg/dL 11   Creatinine 9.55 - 1.00 mg/dL 9.40   Sodium 864 - 854 mmol/L 134   Potassium 3.5 - 5.1 mmol/L 3.9   Chloride  98 - 111 mmol/L 105   CO2 22 - 32 mmol/L 19   Calcium 8.9 - 10.3 mg/dL 8.8   Total Protein 6.5 - 8.1 g/dL 6.8   Total Bilirubin 0.0 - 1.2 mg/dL 0.4   Alkaline Phos 38 - 126 U/L 86   AST 15 - 41 U/L 14   ALT 0 - 44 U/L 13    Edinburgh Score:     No data to display         No data recorded  After visit meds:  Allergies as of 07/30/2024   No Known Allergies   Med Rec must be completed prior to using this [facility]***        Discharge home in stable condition Infant Feeding: Breast Infant Disposition:{CHL IP OB HOME WITH FNUYZM:76418} Discharge  instruction: per After Visit Summary and Postpartum booklet. Activity: Advance as tolerated. Pelvic rest for 6 weeks.  Diet: routine diet Future Appointments: Future Appointments  Date Time Provider Department Center  09/07/2024  9:15 AM Rudy Carlin LABOR, MD [facility] None   Follow up Visit:  Follow-up Information     [facility] for [facility] Follow up.   Specialty: Obstetrics and Gynecology Contact information: [address], [address] [place]  [address] [phone]                 Please schedule this patient for a In person postpartum visit in 6 weeks with the following provider: Any provider. Additional Postpartum F/U:2 hour GTT, Incision check 1 week, and BP check 1 week  High risk pregnancy complicated by: HTN Delivery mode:  C-Section, Low Transverse Anticipated Birth Control:  PP IUD placed   07/30/2024 Vina Solian, MD

## 2024-07-31 DIAGNOSIS — Z59819 Housing instability, housed unspecified: Secondary | ICD-10-CM | POA: Diagnosis present

## 2024-07-31 LAB — CBC
HCT: 35.3 % — ABNORMAL LOW (ref 36.0–46.0)
Hemoglobin: 11.4 g/dL — ABNORMAL LOW (ref 12.0–15.0)
MCH: 26.1 pg (ref 26.0–34.0)
MCHC: 32.3 g/dL (ref 30.0–36.0)
MCV: 81 fL (ref 80.0–100.0)
Platelets: 274 K/uL (ref 150–400)
RBC: 4.36 MIL/uL (ref 3.87–5.11)
RDW: 15.2 % (ref 11.5–15.5)
WBC: 8.1 K/uL (ref 4.0–10.5)
nRBC: 0 % (ref 0.0–0.2)
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
## 2024-07-31 NOTE — Progress Notes (Signed)
 Was noticed by RN S. Flint that the patient's wound vac machine was alarming. Charge nurse was made aware. LPN and CN went into the room and noticed that the plastic round ceil attached to the patient was half off. LPN and CN reforced the area, but noticed that the wound vac itself was not working properly. Several departments were call and messages were left concerning replacing the wound vac. A. Haith [place] mgr stated that she would try to get someone to assist with replacing the wound vac. CN is aware of the situation.

## 2024-07-31 NOTE — Progress Notes (Signed)
 Subjective: Postpartum Day 1: Cesarean Delivery Patient reports tolerating PO, + flatus, and no problems voiding.    Objective: Vital signs in last 24 hours: Temp:  [97.6 F (36.4 C)-97.8 F (36.6 C)] 97.8 F (36.6 C) (11/09 0627) Pulse Rate:  [63-107] 72 (11/09 0627) Resp:  [14-20] 20 (11/09 0627) BP: (98-139)/(56-108) 130/70 (11/09 0627) SpO2:  [94 %-100 %] 97 % (11/09 9372)  Physical Exam:  General: alert, cooperative, and no distress Lochia: appropriate Uterine Fundus: firm Incision: no significant drainage, wound vac present, dressing intact DVT Evaluation: No evidence of DVT seen on physical exam.  Recent Labs    07/30/24 1745 07/31/24 0447  HGB 11.2* 11.4*  HCT 34.9* 35.3*    Assessment/Plan: Status post Cesarean section. Doing well postoperatively.  Continue current care.  Olam Boards, CNM 07/31/2024, 9:02 AM

## 2024-08-01 ENCOUNTER — Encounter (HOSPITAL_COMMUNITY): Payer: Self-pay | Admitting: Certified Registered"

## 2024-08-01 ENCOUNTER — Encounter (HOSPITAL_COMMUNITY): Payer: Self-pay | Admitting: Obstetrics and Gynecology

## 2024-08-01 ENCOUNTER — Other Ambulatory Visit (HOSPITAL_COMMUNITY): Payer: Self-pay

## 2024-08-01 DIAGNOSIS — I10 Essential (primary) hypertension: Secondary | ICD-10-CM | POA: Insufficient documentation

## 2024-08-01 MED ORDER — FUROSEMIDE 20 MG PO TABS
20.0000 mg | ORAL_TABLET | Freq: Every day | ORAL | 0 refills | Status: AC
  Filled 2024-08-01: qty 2, 2d supply, fill #0

## 2024-08-01 MED ORDER — IBUPROFEN 600 MG PO TABS
600.0000 mg | ORAL_TABLET | Freq: Four times a day (QID) | ORAL | 0 refills | Status: AC | PRN
  Filled 2024-08-01: qty 30, 8d supply, fill #0

## 2024-08-01 MED ORDER — ACETAMINOPHEN 500 MG PO TABS
1000.0000 mg | ORAL_TABLET | Freq: Four times a day (QID) | ORAL | 0 refills | Status: AC | PRN
  Filled 2024-08-01: qty 30, 4d supply, fill #0

## 2024-08-01 MED ORDER — OXYCODONE HCL 5 MG PO TABS
5.0000 mg | ORAL_TABLET | Freq: Four times a day (QID) | ORAL | 0 refills | Status: AC | PRN
  Filled 2024-08-01: qty 20, 5d supply, fill #0

## 2024-08-01 MED ORDER — POTASSIUM CHLORIDE CRYS ER 20 MEQ PO TBCR
20.0000 meq | EXTENDED_RELEASE_TABLET | Freq: Every day | ORAL | 0 refills | Status: AC
  Filled 2024-08-01: qty 2, 2d supply, fill #0

## 2024-08-01 MED ORDER — SENNOSIDES-DOCUSATE SODIUM 8.6-50 MG PO TABS
2.0000 | ORAL_TABLET | Freq: Every evening | ORAL | 0 refills | Status: AC | PRN
  Filled 2024-08-01: qty 30, 15d supply, fill #0

## 2024-08-01 NOTE — Patient Instructions (Signed)
 If you are interested in an outpatient lactation consultation -- available in-office or virtually -- please reach out to us  at:  [facility] (First Floor) ?? [address], [place], [place]  ?? [phone] Please leave a message on our lactation voicemail box. We welcome any lactation-related questions or concerns -- our team is here to support you and your baby.  Lactation Support Groups Join us  at: [facility] ?? Tuesdays, 10:00 AM - 12:00 PM ?? [address], [place] [employer], [place]  Lactating parents and lap babies are welcome!  ?? [email or web address]  ?? [email or web address] -------------  Si est interesado en una consulta ambulatoria de lactancia, disponible en el consultorio o virtualmente, comunquese con nosotros en:  [facility] (Primer [facility]iso) ?? [address], [place], [place]  ?? [phone] Por favor, deje un mensaje en nuestro buzn de voz de lactancia. Estamos aqu para responder cualquier pregunta o inquietud relacionada con la lactancia y para apoyarle a usted y a su beb.  Grupos de Apoyo para la Lactancia nase a nosotros en: [facility] ?? Martes, de 10:00 a. m. a 12:00 p. m. ?? [address], [place], [place]  Se admiten madres lactantes y bebs en regazo.  ?? [email or web address]  ?? [email or web address]      Kerri Medina Pelt, [facility] for [facility]

## 2024-08-02 LAB — SURGICAL PATHOLOGY

## 2024-08-02 NOTE — Congregational Nurse Program (Signed)
  Dept: [phone]   Congregational Nurse Program Note  Date of Encounter: 08/02/2024  Past Medical History: Past Medical History:  Diagnosis Date   Medical history non-contributory    Yeast infection     Encounter Details:  Community Questionnaire - 08/02/24 1700       Questionnaire   Ask client: Do you give verbal consent for me to treat you today? Yes    Student Assistance N/A    Location Patient Served  [facility]    Encounter Setting CN site    Population Status Unhoused    Insurance Medicaid    Insurance/Financial Assistance Referral N/A    Medication N/A    Medical Provider Yes    Screening Referrals Made N/A    Medical Referrals Made N/A    Medical Appointment Completed N/A    CNP Interventions Advocate/Support;Case Management    Screenings CN Performed N/A    ED Visit Averted N/A    Life-Saving Intervention Made N/A            Dept: [phone]   Congregational Nurse Program Note  Date of Encounter: 08/02/2024  Past Medical History: Past Medical History:  Diagnosis Date   Medical history non-contributory    Yeast infection     Encounter Details:  Community Questionnaire - 08/02/24 1653       Questionnaire   Ask client: Do you give verbal consent for me to treat you today? Yes    Student Assistance N/A    Location Patient Served  [facility]    Encounter Setting CN site    Population Status Unhoused    Insurance Medicaid    Medication N/A    Medical Provider Yes    Screening Referrals Made N/A    Medical Referrals Made N/A    Medical Appointment Completed N/A    CNP Interventions Advocate/Support;Case Management    Screenings CN Performed N/A    ED Visit Averted N/A    Life-Saving Intervention Made N/A         Brief visit with client today at [facility] after delivery last week. Baby girl is sleeping and doing well. Mom doing okay. Says she just needs to heal since she had a C-section. Has a follow-up Dr's appt. on Friday. Her older  daughter is helping her care for the baby. Follow-up prn.  Beverley Lovett, RN, BSN

## 2024-08-02 NOTE — Telephone Encounter (Signed)
 Error

## 2024-08-03 ENCOUNTER — Encounter (HOSPITAL_COMMUNITY): Payer: Self-pay | Admitting: Obstetrics and Gynecology

## 2024-08-05 ENCOUNTER — Ambulatory Visit

## 2024-08-11 ENCOUNTER — Telehealth: Payer: Self-pay

## 2024-08-16 ENCOUNTER — Telehealth (HOSPITAL_COMMUNITY): Payer: Self-pay | Admitting: *Deleted

## 2024-08-16 ENCOUNTER — Ambulatory Visit

## 2024-08-16 NOTE — Telephone Encounter (Signed)
 08/16/2024  Name: CHANTE MAYSON MRN: [id number] DOB: 1981-08-06  Reason for Call:  Transition of Care Hospital Discharge Call  Contact Status: Patient Contact Status: Message  Language assistant needed:          Follow-Up Questions:    Van Postnatal Depression Scale:  In the Past 7 Days:    PHQ2-9 Depression Scale:     Discharge Follow-up:    Post-discharge interventions: NA  Mliss Sieve, RN 08/16/2024 11:55

## 2024-09-01 NOTE — Congregational Nurse Program (Signed)
°  Dept: [phone]   Congregational Nurse Program Note  Date of Encounter: 09/01/2024  Past Medical History: Past Medical History:  Diagnosis Date   Medical history non-contributory    Yeast infection     Encounter Details:  Community Questionnaire - 08/30/24 1500       Questionnaire   Ask client: Do you give verbal consent for me to treat you today? Yes    Student Assistance N/A    Location Patient Served  [facility]    Encounter Setting CN site    Population Status Unhoused    Insurance Medicaid    Insurance/Financial Assistance Referral N/A    Medication N/A    Medical Provider Yes    Screening Referrals Made N/A    Medical Referrals Made N/A    Medical Appointment Completed N/A    CNP Interventions Advocate/Support;Case Management    Screenings CN Performed N/A    ED Visit Averted N/A    Life-Saving Intervention Made N/A          Spoke with client at [facility]. Says her incision is now healed and she's doing fine. Reports baby is now on a Soy formula. Hoping she will tolerate it better. Follow-up prn.  Beverley Lovett, RN, BSN

## 2024-09-07 ENCOUNTER — Ambulatory Visit: Admitting: Obstetrics

## 2024-09-07 ENCOUNTER — Other Ambulatory Visit

## 2024-10-04 NOTE — Congregational Nurse Program (Signed)
" °  Dept: [phone]   Congregational Nurse Program Note  Date of Encounter: 10/04/2024  Past Medical History: Past Medical History:  Diagnosis Date   Medical history non-contributory    Yeast infection     Encounter Details:  Community Questionnaire - 10/04/24 1435       Questionnaire   Ask client: Do you give verbal consent for me to treat you today? Yes    Student Assistance N/A    Location Patient Served  [facility]    Encounter Setting CN site    Population Status Unhoused    Insurance Medicaid    Insurance/Financial Assistance Referral N/A    Medication N/A    Medical Provider Yes    Medical Referrals Made NA    Medical Appointment Completed N/A    Screenings CN Performed (remember to also record results) NA    CNP Interventions Advocate/Support;Case Management    ED Visit Averted N/A          Spoke with client briefly at [facility]. Says she is doing okay. Reports her baby is now on reflux med. as of yesterday because she was losing weight. Seems to be helping a little but, will follow-up with her Dr. soon.   Beverley Lovett, RN, BSN "

## 2024-10-26 ENCOUNTER — Telehealth: Payer: Self-pay

## 2024-10-26 NOTE — Telephone Encounter (Signed)
 Notes: Reached out to mom via phone call re: referral completed by [employer] Specialist, Clarita Hammock to the [employer]. Unable to reach mom - left voicemail giving short explanation of reason for call and requesting call back.  Asya Derryberry [place]ell Community Navigator [facility] [place] Direct Dial: [phone]
